# Patient Record
Sex: Female | Born: 1980 | Race: White | Hispanic: No | Marital: Single | Smoking: Never smoker
Health system: Southern US, Community
[De-identification: ages and names within clinical notes are randomized; demographics above are authoritative.]

## PROBLEM LIST (undated history)

## (undated) DIAGNOSIS — IMO0002 Reserved for concepts with insufficient information to code with codable children: Secondary | ICD-10-CM

## (undated) DIAGNOSIS — R87619 Unspecified abnormal cytological findings in specimens from cervix uteri: Secondary | ICD-10-CM

## (undated) HISTORY — PX: WISDOM TOOTH EXTRACTION: SHX21

---

## 2006-03-20 ENCOUNTER — Ambulatory Visit (HOSPITAL_COMMUNITY): Admission: RE | Admit: 2006-03-20 | Discharge: 2006-03-20 | Payer: Self-pay | Admitting: Obstetrics

## 2006-07-18 ENCOUNTER — Inpatient Hospital Stay (HOSPITAL_COMMUNITY): Admission: AD | Admit: 2006-07-18 | Discharge: 2006-07-20 | Payer: Self-pay | Admitting: Obstetrics & Gynecology

## 2007-02-05 ENCOUNTER — Emergency Department (HOSPITAL_COMMUNITY): Admission: EM | Admit: 2007-02-05 | Discharge: 2007-02-05 | Payer: Self-pay | Admitting: Emergency Medicine

## 2007-02-08 ENCOUNTER — Emergency Department (HOSPITAL_COMMUNITY): Admission: EM | Admit: 2007-02-08 | Discharge: 2007-02-08 | Payer: Self-pay | Admitting: Emergency Medicine

## 2009-01-10 ENCOUNTER — Ambulatory Visit (HOSPITAL_COMMUNITY): Admission: RE | Admit: 2009-01-10 | Discharge: 2009-01-10 | Payer: Self-pay | Admitting: Obstetrics & Gynecology

## 2009-06-01 ENCOUNTER — Inpatient Hospital Stay (HOSPITAL_COMMUNITY): Admission: AD | Admit: 2009-06-01 | Discharge: 2009-06-02 | Payer: Self-pay | Admitting: Obstetrics

## 2010-06-16 LAB — CBC
Hemoglobin: 9 g/dL — ABNORMAL LOW (ref 12.0–15.0)
Platelets: 226 10*3/uL (ref 150–400)
RBC: 3.68 MIL/uL — ABNORMAL LOW (ref 3.87–5.11)
RDW: 14.1 % (ref 11.5–15.5)
WBC: 11.2 10*3/uL — ABNORMAL HIGH (ref 4.0–10.5)
WBC: 12.6 10*3/uL — ABNORMAL HIGH (ref 4.0–10.5)

## 2010-06-16 LAB — RPR: RPR Ser Ql: NONREACTIVE

## 2010-12-31 LAB — URINE MICROSCOPIC-ADD ON

## 2010-12-31 LAB — URINALYSIS, ROUTINE W REFLEX MICROSCOPIC
Glucose, UA: NEGATIVE
Hgb urine dipstick: NEGATIVE
Ketones, ur: 15 — AB
Protein, ur: 30 — AB
Protein, ur: NEGATIVE
Specific Gravity, Urine: 1.024
Urobilinogen, UA: 0.2
Urobilinogen, UA: 0.2

## 2010-12-31 LAB — URINE CULTURE

## 2010-12-31 LAB — RAPID STREP SCREEN (MED CTR MEBANE ONLY): Streptococcus, Group A Screen (Direct): NEGATIVE

## 2010-12-31 LAB — POCT PREGNANCY, URINE
Operator id: 198171
Preg Test, Ur: NEGATIVE

## 2012-03-12 ENCOUNTER — Emergency Department (HOSPITAL_COMMUNITY)
Admission: EM | Admit: 2012-03-12 | Discharge: 2012-03-12 | Disposition: A | Discharge status: Home / Self Care | Payer: Medicaid Other | Attending: Emergency Medicine | Admitting: Emergency Medicine

## 2012-03-12 ENCOUNTER — Encounter (HOSPITAL_COMMUNITY): Payer: Self-pay | Admitting: *Deleted

## 2012-03-12 DIAGNOSIS — R110 Nausea: Secondary | ICD-10-CM | POA: Insufficient documentation

## 2012-03-12 DIAGNOSIS — Z3490 Encounter for supervision of normal pregnancy, unspecified, unspecified trimester: Secondary | ICD-10-CM

## 2012-03-12 DIAGNOSIS — R197 Diarrhea, unspecified: Secondary | ICD-10-CM | POA: Insufficient documentation

## 2012-03-12 DIAGNOSIS — R509 Fever, unspecified: Secondary | ICD-10-CM | POA: Insufficient documentation

## 2012-03-12 DIAGNOSIS — R1110 Vomiting, unspecified: Secondary | ICD-10-CM

## 2012-03-12 DIAGNOSIS — O212 Late vomiting of pregnancy: Secondary | ICD-10-CM | POA: Insufficient documentation

## 2012-03-12 LAB — URINALYSIS, ROUTINE W REFLEX MICROSCOPIC
Ketones, ur: 40 mg/dL — AB
Nitrite: NEGATIVE
Protein, ur: 30 mg/dL — AB
Urobilinogen, UA: 0.2 mg/dL (ref 0.0–1.0)

## 2012-03-12 LAB — URINE MICROSCOPIC-ADD ON

## 2012-03-12 MED ORDER — SODIUM CHLORIDE 0.9 % IV SOLN
1000.0000 mL | Freq: Once | INTRAVENOUS | Status: AC
  Administered 2012-03-12: 1000 mL via INTRAVENOUS

## 2012-03-12 MED ORDER — ONDANSETRON HCL 4 MG/2ML IJ SOLN
4.0000 mg | Freq: Once | INTRAMUSCULAR | Status: AC
  Administered 2012-03-12: 4 mg via INTRAVENOUS
  Filled 2012-03-12: qty 2

## 2012-03-12 MED ORDER — PROMETHAZINE HCL 25 MG/ML IJ SOLN
12.5000 mg | Freq: Once | INTRAMUSCULAR | Status: AC
  Administered 2012-03-12: 12.5 mg via INTRAVENOUS
  Filled 2012-03-12: qty 1

## 2012-03-12 MED ORDER — ONDANSETRON 4 MG PO TBDP: 4.0000 mg | ORAL_TABLET | Freq: Three times a day (TID) | ORAL | Status: DC | PRN

## 2012-03-12 MED ORDER — SODIUM CHLORIDE 0.9 % IV SOLN
1000.0000 mL | INTRAVENOUS | Status: DC
  Administered 2012-03-12: 1000 mL via INTRAVENOUS

## 2012-03-12 MED ORDER — PROMETHAZINE HCL 25 MG PO TABS: 25.0000 mg | ORAL_TABLET | Freq: Four times a day (QID) | ORAL | Status: DC | PRN

## 2012-03-12 MED ORDER — ONDANSETRON 4 MG PO TBDP
8.0000 mg | ORAL_TABLET | Freq: Once | ORAL | Status: AC
  Administered 2012-03-12: 8 mg via ORAL
  Filled 2012-03-12: qty 2

## 2012-03-12 NOTE — ED Provider Notes (Signed)
Medical screening examination/treatment/procedure(s) were performed by non-physician practitioner and as supervising physician I was immediately available for consultation/collaboration.    Nelia Shi, MD 03/12/12 [phone]

## 2012-03-12 NOTE — ED Notes (Signed)
Karen, PA at the bedside.  

## 2012-03-12 NOTE — ED Notes (Signed)
PT reports GI symptoms started approx. @AM .  Pt reports multiple episodes of N/V D. Pt also reports to be 25 wks gestion with no primary OB.

## 2012-03-12 NOTE — ED Notes (Signed)
Discharge instructions reviewed. Pt verbalized understanding.  

## 2012-03-12 NOTE — ED Provider Notes (Signed)
History     CSN: [id number]  Arrival date & time 03/12/12  0930   First MD Initiated Contact with Patient 03/12/12 1058      Chief Complaint  Patient presents with  . Nausea  . Emesis  . Diarrhea    (Consider location/radiation/quality/duration/timing/severity/associated sxs/prior treatment) Patient is a 31 y.o. female presenting with vomiting. The history is provided by the patient. No language interpreter was used.  Emesis  This is a new problem. The current episode started 12 to 24 hours ago. The problem occurs 5 to 10 times per day. The problem has not changed since onset.The emesis has an appearance of stomach contents. There has been no fever. Associated symptoms include chills and a fever. Pertinent negatives include no abdominal pain.  Pt has had vomiting and diarrhea.  Pt reports she is [redacted] weeks pregnant.  Pt has not had any prenatal care.   Pt is worried that she is getting dehydrated.    History reviewed. No pertinent past medical history.  History reviewed. No pertinent past surgical history.  No family history on file.  History  Substance Use Topics  . Smoking status: Never Smoker   . Smokeless tobacco: Not on file  . Alcohol Use: No    OB History    Grav Para Term Preterm Abortions TAB SAB Ect Mult Living   1               Review of Systems  Constitutional: Positive for fever and chills.  Gastrointestinal: Positive for vomiting. Negative for abdominal pain.  All other systems reviewed and are negative.    Allergies  Review of patient's allergies indicates no known allergies.  Home Medications  No current outpatient prescriptions on file.  BP 108/70  Pulse 116  Temp 98 F (36.7 C) (Oral)  Resp 18  SpO2 98%  LMP 08/01/2011  Physical Exam  Nursing note and vitals reviewed. Constitutional: She is oriented to person, place, and time. She appears well-developed and well-nourished.  HENT:  Head: Normocephalic and atraumatic.  Eyes:  Conjunctivae normal and EOM are normal. Pupils are equal, round, and reactive to light.  Neck: Normal range of motion. Neck supple.  Cardiovascular: Normal rate.   Pulmonary/Chest: Effort normal and breath sounds normal.  Abdominal: Soft. Bowel sounds are normal.  Musculoskeletal: Normal range of motion.  Neurological: She is alert and oriented to person, place, and time. She has normal reflexes.  Skin: Skin is warm.  Psychiatric: She has a normal mood and affect.    ED Course  Procedures (including critical care time)  Labs Reviewed  URINALYSIS, ROUTINE W REFLEX MICROSCOPIC - Abnormal; Notable for the following:    Color, Urine AMBER (*)  BIOCHEMICALS MAY BE AFFECTED BY COLOR   APPearance CLOUDY (*)     Ketones, ur 40 (*)     Protein, ur 30 (*)     Leukocytes, UA SMALL (*)     All other components within normal limits  URINE MICROSCOPIC-ADD ON - Abnormal; Notable for the following:    Squamous Epithelial / LPF MANY (*)     Bacteria, UA FEW (*)     Casts GRANULAR CAST (*)     All other components within normal limits  URINE CULTURE   No results found.   No diagnosis found.    MDM  Urine shows 40 ketones,   Pt given IV fluids x 2 liters,  zofran stopped vomiting.    Pt advised to go to [facility] hospital  if symptoms persist.   Pt given rx for zofran.     Lonia Skinner [place], [place] 03/12/12 1356

## 2012-03-13 LAB — URINE CULTURE: Colony Count: 6000

## 2012-03-24 NOTE — L&D Delivery Note (Signed)
Delivery Note At 10:04 PM a viable female was delivered via Vaginal, Spontaneous Delivery (Presentation: ;  ).  APGAR: , ; weight .   Placenta status: , .  Cord:  with the following complications: .  Cord pH: not done  Anesthesia: Epidural  Episiotomy:  Lacerations:  Suture Repair: 2.0 Est. Blood Loss (mL):   Mom to postpartum.  Baby to nursery-stable.  Kyliee Ortego A 06/10/2012, 10:09 PM

## 2012-04-20 LAB — OB RESULTS CONSOLE GC/CHLAMYDIA
Chlamydia: NEGATIVE
Chlamydia: NEGATIVE
Gonorrhea: NEGATIVE
Gonorrhea: NEGATIVE

## 2012-04-20 LAB — OB RESULTS CONSOLE HIV ANTIBODY (ROUTINE TESTING): HIV: NONREACTIVE

## 2012-04-20 LAB — OB RESULTS CONSOLE RPR
RPR: NONREACTIVE
RPR: NONREACTIVE

## 2012-04-20 LAB — OB RESULTS CONSOLE HEPATITIS B SURFACE ANTIGEN: Hepatitis B Surface Ag: NEGATIVE

## 2012-04-20 LAB — OB RESULTS CONSOLE ANTIBODY SCREEN: Antibody Screen: NEGATIVE

## 2012-06-06 ENCOUNTER — Encounter (HOSPITAL_COMMUNITY): Payer: Self-pay | Admitting: *Deleted

## 2012-06-06 ENCOUNTER — Inpatient Hospital Stay (HOSPITAL_COMMUNITY)
Admission: AD | Admit: 2012-06-06 | Discharge: 2012-06-06 | Disposition: A | Discharge status: Home / Self Care | Payer: Medicaid Other | Source: Ambulatory Visit | Attending: Obstetrics & Gynecology | Admitting: Obstetrics & Gynecology

## 2012-06-06 DIAGNOSIS — O479 False labor, unspecified: Secondary | ICD-10-CM | POA: Insufficient documentation

## 2012-06-06 HISTORY — DX: Reserved for concepts with insufficient information to code with codable children: IMO0002

## 2012-06-06 HISTORY — DX: Unspecified abnormal cytological findings in specimens from cervix uteri: R87.619

## 2012-06-06 NOTE — MAU Note (Signed)
Pt reports having dull contractions on and off all night. Reports some clear discharge no bleeding and good fetal movement.

## 2012-06-07 ENCOUNTER — Other Ambulatory Visit: Payer: Self-pay | Admitting: Obstetrics & Gynecology

## 2012-06-08 ENCOUNTER — Telehealth (HOSPITAL_COMMUNITY): Payer: Self-pay | Admitting: *Deleted

## 2012-06-08 ENCOUNTER — Encounter (HOSPITAL_COMMUNITY): Payer: Self-pay | Admitting: *Deleted

## 2012-06-08 NOTE — Telephone Encounter (Signed)
Preadmission screen  

## 2012-06-10 ENCOUNTER — Inpatient Hospital Stay (HOSPITAL_COMMUNITY)
Admission: AD | Admit: 2012-06-10 | Discharge: 2012-06-12 | DRG: 775 | Disposition: A | Discharge status: Home / Self Care | Payer: Medicaid Other | Source: Ambulatory Visit | Attending: Obstetrics | Admitting: Obstetrics

## 2012-06-10 ENCOUNTER — Encounter (HOSPITAL_COMMUNITY): Payer: Self-pay

## 2012-06-10 ENCOUNTER — Inpatient Hospital Stay (HOSPITAL_COMMUNITY): Payer: Medicaid Other | Admitting: Anesthesiology

## 2012-06-10 ENCOUNTER — Encounter (HOSPITAL_COMMUNITY): Payer: Self-pay | Admitting: Anesthesiology

## 2012-06-10 ENCOUNTER — Inpatient Hospital Stay (HOSPITAL_COMMUNITY): Payer: Medicaid Other

## 2012-06-10 DIAGNOSIS — O99892 Other specified diseases and conditions complicating childbirth: Principal | ICD-10-CM | POA: Diagnosis present

## 2012-06-10 DIAGNOSIS — Z22330 Carrier of Group B streptococcus: Secondary | ICD-10-CM

## 2012-06-10 LAB — CBC
HCT: 34.6 % — ABNORMAL LOW (ref 36.0–46.0)
MCH: 29.6 pg (ref 26.0–34.0)
MCV: 88.9 fL (ref 78.0–100.0)
RBC: 3.89 MIL/uL (ref 3.87–5.11)
WBC: 10.9 10*3/uL — ABNORMAL HIGH (ref 4.0–10.5)

## 2012-06-10 LAB — RPR: RPR Ser Ql: NONREACTIVE

## 2012-06-10 MED ORDER — LACTATED RINGERS IV SOLN: 500.0000 mL | INTRAVENOUS | Status: DC | PRN

## 2012-06-10 MED ORDER — ACETAMINOPHEN 325 MG PO TABS: 650.0000 mg | ORAL_TABLET | ORAL | Status: DC | PRN

## 2012-06-10 MED ORDER — PHENYLEPHRINE 40 MCG/ML (10ML) SYRINGE FOR IV PUSH (FOR BLOOD PRESSURE SUPPORT): 80.0000 ug | PREFILLED_SYRINGE | INTRAVENOUS | Status: DC | PRN

## 2012-06-10 MED ORDER — FENTANYL 2.5 MCG/ML BUPIVACAINE 1/10 % EPIDURAL INFUSION (WH - ANES)
INTRAMUSCULAR | Status: DC | PRN
  Administered 2012-06-10: 14 mL/h via EPIDURAL

## 2012-06-10 MED ORDER — DIPHENHYDRAMINE HCL 50 MG/ML IJ SOLN: 12.5000 mg | INTRAMUSCULAR | Status: DC | PRN

## 2012-06-10 MED ORDER — PHENYLEPHRINE 40 MCG/ML (10ML) SYRINGE FOR IV PUSH (FOR BLOOD PRESSURE SUPPORT)
80.0000 ug | PREFILLED_SYRINGE | INTRAVENOUS | Status: DC | PRN
  Filled 2012-06-10: qty 5

## 2012-06-10 MED ORDER — LACTATED RINGERS IV SOLN: 500.0000 mL | Freq: Once | INTRAVENOUS | Status: DC

## 2012-06-10 MED ORDER — PENICILLIN G POTASSIUM 5000000 UNITS IJ SOLR
2.5000 10*6.[IU] | INTRAVENOUS | Status: DC
  Filled 2012-06-10 (×2): qty 2.5

## 2012-06-10 MED ORDER — OXYTOCIN 40 UNITS IN LACTATED RINGERS INFUSION - SIMPLE MED
62.5000 mL/h | INTRAVENOUS | Status: DC
  Filled 2012-06-10: qty 1000

## 2012-06-10 MED ORDER — LACTATED RINGERS IV SOLN: INTRAVENOUS | Status: DC

## 2012-06-10 MED ORDER — EPHEDRINE 5 MG/ML INJ: 10.0000 mg | INTRAVENOUS | Status: DC | PRN

## 2012-06-10 MED ORDER — ONDANSETRON HCL 4 MG/2ML IJ SOLN: 4.0000 mg | Freq: Four times a day (QID) | INTRAMUSCULAR | Status: DC | PRN

## 2012-06-10 MED ORDER — CITRIC ACID-SODIUM CITRATE 334-500 MG/5ML PO SOLN: 30.0000 mL | ORAL | Status: DC | PRN

## 2012-06-10 MED ORDER — LIDOCAINE HCL (PF) 1 % IJ SOLN
30.0000 mL | INTRAMUSCULAR | Status: DC | PRN
  Filled 2012-06-10: qty 30

## 2012-06-10 MED ORDER — IBUPROFEN 600 MG PO TABS
600.0000 mg | ORAL_TABLET | Freq: Four times a day (QID) | ORAL | Status: DC | PRN
  Filled 2012-06-10 (×2): qty 1

## 2012-06-10 MED ORDER — LIDOCAINE HCL (PF) 1 % IJ SOLN
INTRAMUSCULAR | Status: DC | PRN
  Administered 2012-06-10 (×2): 8 mL

## 2012-06-10 MED ORDER — FENTANYL 2.5 MCG/ML BUPIVACAINE 1/10 % EPIDURAL INFUSION (WH - ANES)
14.0000 mL/h | INTRAMUSCULAR | Status: DC | PRN
  Filled 2012-06-10: qty 125

## 2012-06-10 MED ORDER — OXYCODONE-ACETAMINOPHEN 5-325 MG PO TABS: 1.0000 | ORAL_TABLET | ORAL | Status: DC | PRN

## 2012-06-10 MED ORDER — EPHEDRINE 5 MG/ML INJ
10.0000 mg | INTRAVENOUS | Status: DC | PRN
  Filled 2012-06-10: qty 4

## 2012-06-10 MED ORDER — BUTORPHANOL TARTRATE 1 MG/ML IJ SOLN: 1.0000 mg | INTRAMUSCULAR | Status: DC | PRN

## 2012-06-10 MED ORDER — OXYTOCIN BOLUS FROM INFUSION: 500.0000 mL | INTRAVENOUS | Status: DC

## 2012-06-10 MED ORDER — DEXTROSE 5 % IV SOLN
5.0000 10*6.[IU] | Freq: Once | INTRAVENOUS | Status: AC
  Administered 2012-06-10: 5 10*6.[IU] via INTRAVENOUS
  Filled 2012-06-10: qty 5

## 2012-06-10 NOTE — Anesthesia Procedure Notes (Signed)
Epidural Patient location during procedure: OB Start time: 06/10/2012 8:06 PM End time: 06/10/2012 8:10 PM  Staffing Anesthesiologist: Sandrea Hughs Performed by: anesthesiologist   Preanesthetic Checklist Completed: patient identified, site marked, surgical consent, pre-op evaluation, timeout performed, IV checked, risks and benefits discussed and monitors and equipment checked  Epidural Patient position: sitting Prep: site prepped and draped and DuraPrep Patient monitoring: continuous pulse ox and blood pressure Approach: midline Injection technique: LOR air  Needle:  Needle type: Tuohy  Needle gauge: 17 G Needle length: 9 cm and 9 Needle insertion depth: 5 cm cm Catheter type: closed end flexible Catheter size: 19 Gauge Catheter at skin depth: 10 cm Test dose: negative and Other  Assessment Sensory level: T9 Events: blood not aspirated, injection not painful, no injection resistance, negative IV test and no paresthesia  Additional Notes Reason for block:procedure for pain

## 2012-06-10 NOTE — MAU Note (Signed)
Dr. Gaynell Face returned call, notified of pt's cervical exam post u/s, cervix changed from 4.5-6, 70%, 0station. Orders to admit, pcn protocol

## 2012-06-10 NOTE — MAU Note (Signed)
Pt states bleeding noted today. Has never had bloody show with prior pregnancies when in labor. Concerned about amount of bleeding.

## 2012-06-10 NOTE — Anesthesia Preprocedure Evaluation (Signed)

## 2012-06-10 NOTE — H&P (Signed)
This is Dr. Francoise Ceo dictating the history and physical on  Charlotte Anderson she's a 32 year old gravida 5 para 3013 positive GBS 40 weeks and 3 days [facility] 06/07/2012 she was admitted in labor 6 cm she is now 7-8 cm vertex -1 station amniotomy performed the fluids clear she received penicillin for positive GBS Past medical history negative Past surgical history negative Social history negative System review noncontributory Physical exam well-developed female in labor HEENT negative Lungs clear to P&A Heart regular rhythm no murmurs no gallops Abdomen term Pelvic as described above Extremities negative

## 2012-06-11 ENCOUNTER — Ambulatory Visit (HOSPITAL_COMMUNITY): Payer: Medicaid Other

## 2012-06-11 LAB — CBC
HCT: 32.3 % — ABNORMAL LOW (ref 36.0–46.0)
MCV: 89.5 fL (ref 78.0–100.0)
RBC: 3.61 MIL/uL — ABNORMAL LOW (ref 3.87–5.11)
RDW: 14.7 % (ref 11.5–15.5)
WBC: 13.4 10*3/uL — ABNORMAL HIGH (ref 4.0–10.5)

## 2012-06-11 MED ORDER — PRENATAL MULTIVITAMIN CH
1.0000 | ORAL_TABLET | Freq: Every day | ORAL | Status: DC
  Administered 2012-06-11 – 2012-06-12 (×2): 1 via ORAL
  Filled 2012-06-11: qty 1

## 2012-06-11 MED ORDER — LANOLIN HYDROUS EX OINT: TOPICAL_OINTMENT | CUTANEOUS | Status: DC | PRN

## 2012-06-11 MED ORDER — DIPHENHYDRAMINE HCL 25 MG PO CAPS: 25.0000 mg | ORAL_CAPSULE | Freq: Four times a day (QID) | ORAL | Status: DC | PRN

## 2012-06-11 MED ORDER — TETANUS-DIPHTH-ACELL PERTUSSIS 5-2.5-18.5 LF-MCG/0.5 IM SUSP
0.5000 mL | Freq: Once | INTRAMUSCULAR | Status: AC
  Administered 2012-06-11: 0.5 mL via INTRAMUSCULAR
  Filled 2012-06-11: qty 0.5

## 2012-06-11 MED ORDER — ONDANSETRON HCL 4 MG/2ML IJ SOLN: 4.0000 mg | INTRAMUSCULAR | Status: DC | PRN

## 2012-06-11 MED ORDER — BENZOCAINE-MENTHOL 20-0.5 % EX AERO
1.0000 "application " | INHALATION_SPRAY | CUTANEOUS | Status: DC | PRN
  Administered 2012-06-11: 1 via TOPICAL
  Filled 2012-06-11: qty 56

## 2012-06-11 MED ORDER — SENNOSIDES-DOCUSATE SODIUM 8.6-50 MG PO TABS
2.0000 | ORAL_TABLET | Freq: Every day | ORAL | Status: DC
  Administered 2012-06-11 (×2): 2 via ORAL

## 2012-06-11 MED ORDER — ONDANSETRON HCL 4 MG PO TABS: 4.0000 mg | ORAL_TABLET | ORAL | Status: DC | PRN

## 2012-06-11 MED ORDER — IBUPROFEN 600 MG PO TABS
600.0000 mg | ORAL_TABLET | Freq: Four times a day (QID) | ORAL | Status: DC
  Administered 2012-06-11 – 2012-06-12 (×6): 600 mg via ORAL
  Filled 2012-06-11 (×5): qty 1

## 2012-06-11 MED ORDER — WITCH HAZEL-GLYCERIN EX PADS: 1.0000 "application " | MEDICATED_PAD | CUTANEOUS | Status: DC | PRN

## 2012-06-11 MED ORDER — ZOLPIDEM TARTRATE 5 MG PO TABS: 5.0000 mg | ORAL_TABLET | Freq: Every evening | ORAL | Status: DC | PRN

## 2012-06-11 MED ORDER — SIMETHICONE 80 MG PO CHEW: 80.0000 mg | CHEWABLE_TABLET | ORAL | Status: DC | PRN

## 2012-06-11 MED ORDER — OXYCODONE-ACETAMINOPHEN 5-325 MG PO TABS: 1.0000 | ORAL_TABLET | ORAL | Status: DC | PRN

## 2012-06-11 MED ORDER — DIBUCAINE 1 % RE OINT: 1.0000 "application " | TOPICAL_OINTMENT | RECTAL | Status: DC | PRN

## 2012-06-11 MED ORDER — FERROUS SULFATE 325 (65 FE) MG PO TABS
325.0000 mg | ORAL_TABLET | Freq: Two times a day (BID) | ORAL | Status: DC
  Administered 2012-06-11 – 2012-06-12 (×2): 325 mg via ORAL
  Filled 2012-06-11 (×2): qty 1

## 2012-06-11 NOTE — Progress Notes (Signed)
Post Partum Day 1 Subjective: no complaints  Objective: Blood pressure 114/87, pulse 84, temperature 97.6 F (36.4 C), temperature source Oral, resp. rate 20, height 5\' 5"  (1.651 m), weight 163 lb 8 oz (74.163 kg), last menstrual period 08/01/2011, SpO2 98.00%, unknown if currently breastfeeding.  Physical Exam:  General:  Alert.  No distress. Lochia: appropriate Uterine Fundus: firm Incision: healing well DVT Evaluation: No evidence of DVT seen on physical exam.   Recent Labs  06/10/12 1825 06/11/12 0605  HGB 11.5* 10.6*  HCT 34.6* 32.3*    Assessment/Plan: Plan for discharge tomorrow   LOS: 1 day   HARPER,CHARLES A 06/11/2012, 11:39 AM

## 2012-06-11 NOTE — Anesthesia Postprocedure Evaluation (Signed)
  Anesthesia Post-op Note  Patient: Charlotte Anderson  Procedure(s) Performed: * No procedures listed *  Patient Location: PACU and Mother/Baby  Anesthesia Type:Epidural  Level of Consciousness: awake, alert , oriented and patient cooperative  Airway and Oxygen Therapy: Patient Spontanous Breathing  Post-op Pain: none  Post-op Assessment: Post-op Vital signs reviewed and Patient's Cardiovascular Status Stable  Post-op Vital Signs: Reviewed and stable  Complications: No apparent anesthesia complications

## 2012-06-12 MED ORDER — IBUPROFEN 600 MG PO TABS: 600.0000 mg | ORAL_TABLET | Freq: Four times a day (QID) | ORAL | Status: DC | PRN

## 2012-06-12 MED ORDER — OXYCODONE-ACETAMINOPHEN 5-325 MG PO TABS: 1.0000 | ORAL_TABLET | ORAL | Status: DC | PRN

## 2012-06-12 NOTE — Progress Notes (Signed)
Post Partum Day 2 Subjective: no complaints  Objective: Blood pressure 105/70, pulse 77, temperature 97.9 F (36.6 C), temperature source Oral, resp. rate 18, height 5\' 5"  (1.651 m), weight 163 lb 8 oz (74.163 kg), last menstrual period 08/01/2011, SpO2 99.00%, unknown if currently breastfeeding.  Physical Exam:  General: alert and no distress Lochia: appropriate Uterine Fundus: firm Incision: none DVT Evaluation: No evidence of DVT seen on physical exam.   Recent Labs  06/10/12 1825 06/11/12 0605  HGB 11.5* 10.6*  HCT 34.6* 32.3*    Assessment/Plan: Discharge home   LOS: 2 days   HARPER,CHARLES A 06/12/2012, 8:47 AM

## 2012-06-12 NOTE — Discharge Summary (Signed)
Obstetric Discharge Summary Reason for Admission: onset of labor Prenatal Procedures: ultrasound Intrapartum Procedures: spontaneous vaginal delivery Postpartum Procedures: none Complications-Operative and Postpartum: none Hemoglobin  Date Value Range Status  06/11/2012 10.6* 12.0 - 15.0 g/dL Final     HCT  Date Value Range Status  06/11/2012 32.3* 36.0 - 46.0 % Final    Physical Exam:  General: alert and no distress Lochia: appropriate Uterine Fundus: firm Incision: None DVT Evaluation: No evidence of DVT seen on physical exam.  Discharge Diagnoses: Term Pregnancy-delivered  Discharge Information: Date: 06/12/2012 Activity: pelvic rest Diet: routine Medications: PNV, Ibuprofen, Colace and Percocet Condition: stable Instructions: refer to practice specific booklet Discharge to: home Follow-up Information   Follow up with Barron Vanloan A, MD. Schedule an appointment as soon as possible for a visit in 6 weeks.   Contact information:   [address] [place] [address] [phone]       Newborn Data: Live born female  Birth Weight: 7 lb 14.6 oz (3589 g) APGAR: 10, 10  Home with mother.  Charlotte Anderson A 06/12/2012, 8:56 AM

## 2012-06-14 ENCOUNTER — Encounter: Payer: Self-pay | Admitting: Obstetrics

## 2012-06-15 ENCOUNTER — Inpatient Hospital Stay (HOSPITAL_COMMUNITY)
Admission: RE | Admit: 2012-06-15 | Discharge: 2012-06-15 | Disposition: A | Discharge status: Home / Self Care | Payer: Medicaid Other | Source: Ambulatory Visit | Attending: Obstetrics | Admitting: Obstetrics

## 2012-07-15 ENCOUNTER — Encounter: Payer: Self-pay | Admitting: Obstetrics

## 2012-07-15 ENCOUNTER — Ambulatory Visit (INDEPENDENT_AMBULATORY_CARE_PROVIDER_SITE_OTHER): Payer: Medicaid Other | Admitting: Obstetrics

## 2012-07-15 NOTE — Progress Notes (Signed)
Subjective:     Charlotte Anderson is a 32 y.o. female who presents for a postpartum visit. She is 5 weeks postpartum following a spontaneous vaginal delivery. I have fully reviewed the prenatal and intrapartum course. The delivery was at 40.3 gestational weeks. Outcome: spontaneous vaginal delivery. Anesthesia: epidural. Postpartum course has been normal. Baby's course has been normal. Baby is feeding by breast. Bleeding thin lochia. Bowel function is normal. Bladder function is normal. Patient is not sexually active. Contraception method is abstinence. Postpartum depression screening: negative.   The following portions of the patient's history were reviewed and updated as appropriate: allergies, current medications, past family history, past medical history, past social history, past surgical history and problem list.  Review of Systems Pertinent items are noted in HPI.   Objective:    BP 104/74  Pulse 96  Temp(Src) 98.3 F (36.8 C)  Ht 5\' 5"  (1.651 m)  Wt 144 lb (65.318 kg)  BMI 23.96 kg/m2  Breastfeeding? Yes  General:  alert and no distress   Breasts:  inspection negative, no nipple discharge or bleeding, no masses or nodularity palpable  Lungs: not examined  Heart:  not examined  Abdomen: normal findings: soft, non-tender   Vulva:  normal  Vagina: normal vagina  Cervix:  no lesions  Corpus: normal size, contour, position, consistency, mobility, non-tender  Adnexa:  normal adnexa  Rectal Exam: Not performed.        Assessment:     Normal postpartum exam. Pap smear not done at today's visit.   Plan:    1. Contraception: none 2. Wants tubal sterilization 3. Follow up in: 2 weeks or as needed.

## 2012-07-15 NOTE — Patient Instructions (Signed)
Tubal sterilization methods.

## 2012-08-05 ENCOUNTER — Ambulatory Visit (INDEPENDENT_AMBULATORY_CARE_PROVIDER_SITE_OTHER): Payer: Medicaid Other | Admitting: Obstetrics & Gynecology

## 2012-08-05 ENCOUNTER — Encounter: Payer: Self-pay | Admitting: Obstetrics & Gynecology

## 2012-08-05 VITALS — BP 118/84 | HR 83 | Temp 97.7°F | Ht 65.0 in | Wt 144.4 lb

## 2012-08-05 DIAGNOSIS — Z3202 Encounter for pregnancy test, result negative: Secondary | ICD-10-CM

## 2012-08-05 DIAGNOSIS — Z3009 Encounter for other general counseling and advice on contraception: Secondary | ICD-10-CM

## 2012-08-05 NOTE — Patient Instructions (Signed)
Laparoscopic Tubal Ligation Laparoscopic tubal ligation is a procedure that closes the fallopian tubes at a time other than right after childbirth. By closing the fallopian tubes, the eggs that are released from the ovaries cannot enter the uterus and sperm cannot reach the egg. Tubal ligation is also known as getting your "tubes tied." Tubal ligation is done so you will not be able to get pregnant or have a baby.  Although this procedure may be reversed, it should be considered permanent and irreversible. If you want to have future pregnancies, you should not have this procedure.  LET YOUR CAREGIVER KNOW ABOUT:  Allergies to food or medicine.  Medicines taken, including vitamins, herbs, eyedrops, over-the-counter medicines, and creams.  Use of steroids (by mouth or creams).  Previous problems with numbing medicines.  History of bleeding problems or blood clots.  Any recent colds or infections.  Previous surgery.  Other health problems, including diabetes and kidney problems.  Possibility of pregnancy, if this applies.  Any past pregnancies. RISKS AND COMPLICATIONS   Infection.  Bleeding.  Injury to surrounding organs.  Anesthetic side effects.  Failure of the procedure.  Ectopic pregnancy.  Future regret about having the procedure done. BEFORE THE PROCEDURE  Do not take aspirin or blood thinners a week before the procedure or as directed. This can cause bleeding.  Do not eat or drink anything 6 to 8 hours before the procedure. PROCEDURE   You may be given a medicine to help you relax (sedative) before the procedure. You will be given a medicine to make you sleep (general anesthetic) during the procedure.  A tube will be put down your throat to help your breath while under general anesthesia.  Two small cuts (incisions) are made in the lower abdominal area and near the belly button.  Your abdominal area will be inflated with a safe gas (carbon dioxide). This helps  give the surgeon room to operate, visualize, and helps the surgeon avoid other organs.  A thin, lighted tube (laparoscope) with a camera attached is inserted into your abdomen through one of the incisions near the belly button. Other small instruments are also inserted through the other abdominal incision.  The fallopian tubes are located and are either blocked with a ring, clip, or are burned (cauterized).  After the fallopian tubes are blocked, the gas is released from the abdomen.  The incisions will be closed with stitches (sutures), and a bandage may be placed over the incisions. AFTER THE PROCEDURE   You will rest in a recovery room for 1 4 hours until you are stable and doing well.  You will also have some mild abdominal discomfort for 3 7 days. You will be given pain medicine to ease any discomfort.  As long as there are no problems, you may be allowed to go home. Someone will need to drive you home and be with you for at least 24 hours once home.  You may have some mild discomfort in the throat. This is from the tube placed in your throat while you were sleeping.  You may experience discomfort in the shoulder area from some trapped air between the liver and diaphragm. This sensation is normal and will slowly go away on its own. Document Released: 06/16/2000 Document Revised: 09/09/2011 Document Reviewed: 06/21/2011 Exit[employer] Patient Information 2013 [employer].  

## 2012-08-05 NOTE — Progress Notes (Signed)
   Subjective:    Charlotte Anderson is a 32 y.o. female who presents for contraception counseling.  She is interested in getting a tubal ligationThe patient has no complaints today. The patient is sexually active. Pertinent past medical history: none.  Menstrual History: OB History   Grav Para Term Preterm Abortions TAB SAB Ect Mult Living   5 4 4  1 1    4       Menarche age: 18 No LMP recorded. Patient is not currently having periods (Reason: Lactating).    The following portions of the patient's history were reviewed and updated as appropriate: allergies, current medications, past family history, past medical history, past social history, past surgical history and problem list.  Review of Systems Pertinent items are noted in HPI.   Objective:     No exam     Assessment:    32 y.o., desires sterilization  Plan:   A laparoscopic BTL is planned

## 2012-08-24 ENCOUNTER — Inpatient Hospital Stay (HOSPITAL_COMMUNITY): Admission: RE | Admit: 2012-08-24 | Payer: Medicaid Other | Source: Ambulatory Visit

## 2012-08-26 ENCOUNTER — Encounter (HOSPITAL_COMMUNITY): Payer: Self-pay

## 2012-08-26 ENCOUNTER — Encounter (HOSPITAL_COMMUNITY)
Admission: RE | Admit: 2012-08-26 | Discharge: 2012-08-26 | Disposition: A | Discharge status: Home / Self Care | Payer: Medicaid Other | Source: Ambulatory Visit | Attending: Obstetrics & Gynecology | Admitting: Obstetrics & Gynecology

## 2012-08-26 ENCOUNTER — Encounter (HOSPITAL_COMMUNITY): Payer: Self-pay | Admitting: Pharmacy Technician

## 2012-08-26 LAB — CBC
HCT: 38.1 % (ref 36.0–46.0)
MCV: 87.6 fL (ref 78.0–100.0)
RBC: 4.35 MIL/uL (ref 3.87–5.11)
WBC: 8.1 10*3/uL (ref 4.0–10.5)

## 2012-08-26 NOTE — Patient Instructions (Addendum)
   Your procedure is scheduled on: Friday, June 13   Enter through the [employer] of [facility] at: 1030 am Pick up the phone at the desk and dial [phone] and inform us of your arrival.  Please call this number if you have any problems the morning of surgery: [phone]  Remember: Do not eat food after midnight: Thursday Do not drink clear liquids after: 8 am Friday, June 13 Take these medicines the morning of surgery with a SIP OF WATER: None  Do not wear jewelry, make-up, or FINGER nail polish No metal in your hair or on your body. Do not wear lotions, powders, perfumes. You may wear deodorant.  Please use your CHG wash as directed prior to surgery.  Do not shave anywhere for at least 12 hours prior to first CHG shower.  Do not bring valuables to the hospital. Contacts, dentures or bridgework may not be worn into surgery.  Patients discharged on the day of surgery will not be allowed to drive home.  Home with friend Merian Capron cell [phone].

## 2012-08-31 ENCOUNTER — Encounter: Payer: Self-pay | Admitting: Obstetrics

## 2012-08-31 ENCOUNTER — Other Ambulatory Visit: Payer: Self-pay | Admitting: *Deleted

## 2012-09-02 NOTE — H&P (Signed)
  Chief Complaint: 32 y.o. who presents with for a laparoscopic BTL  Details of Present Illness: See above.  There were no vitals taken for this visit.  Past Medical History  Diagnosis Date  . Abnormal Pap smear   . SVD (spontaneous vaginal delivery)     x 4   History   Social History  . Marital Status: Single    Spouse Name: N/A    Number of Children: N/A  . Years of Education: N/A   Occupational History  . Not on file.   Social History Main Topics  . Smoking status: Never Smoker   . Smokeless tobacco: Never Used  . Alcohol Use: No  . Drug Use: No  . Sexually Active: Yes    Birth Control/ Protection: Condom   Other Topics Concern  . Not on file   Social History Narrative  . No narrative on file   Family History  Problem Relation Age of Onset  . Cancer Mother     ovarian  . Hypertension Mother   . Vision loss Maternal Grandmother     cataracts  . Hypertension Maternal Grandmother   . Heart disease Maternal Grandfather   . Hypertension Maternal Grandfather   . COPD Paternal Grandmother     Pertinent items are noted in HPI.  Pre-Op Diagnosis: desires sterilization   Planned Procedure: Procedure(s): LAPAROSCOPIC TUBAL LIGATION  I have reviewed the patient's history and have completed the physical exam and Charlotte Anderson is acceptable for surgery.  Roseanna Rainbow, MD 09/02/2012 7:26 PM

## 2012-09-03 ENCOUNTER — Ambulatory Visit (HOSPITAL_COMMUNITY): Payer: Medicaid Other

## 2012-09-03 ENCOUNTER — Encounter (HOSPITAL_COMMUNITY): Payer: Self-pay | Admitting: Anesthesiology

## 2012-09-03 ENCOUNTER — Encounter (HOSPITAL_COMMUNITY): Payer: Self-pay

## 2012-09-03 ENCOUNTER — Ambulatory Visit (HOSPITAL_COMMUNITY)
Admission: RE | Admit: 2012-09-03 | Discharge: 2012-09-03 | Disposition: A | Discharge status: Home / Self Care | Payer: Medicaid Other | Source: Ambulatory Visit | Attending: Obstetrics & Gynecology | Admitting: Obstetrics & Gynecology

## 2012-09-03 ENCOUNTER — Encounter (HOSPITAL_COMMUNITY)
Admission: RE | Disposition: A | Discharge status: Home / Self Care | Payer: Self-pay | Source: Ambulatory Visit | Attending: Obstetrics & Gynecology

## 2012-09-03 DIAGNOSIS — Z641 Problems related to multiparity: Secondary | ICD-10-CM | POA: Diagnosis not present

## 2012-09-03 DIAGNOSIS — Z302 Encounter for sterilization: Secondary | ICD-10-CM | POA: Insufficient documentation

## 2012-09-03 DIAGNOSIS — Z3009 Encounter for other general counseling and advice on contraception: Secondary | ICD-10-CM

## 2012-09-03 HISTORY — PX: LAPAROSCOPIC TUBAL LIGATION: SHX1937

## 2012-09-03 LAB — PREGNANCY, URINE: Preg Test, Ur: NEGATIVE

## 2012-09-03 SURGERY — LIGATION, FALLOPIAN TUBE, LAPAROSCOPIC
Anesthesia: General | Site: Abdomen | Laterality: Bilateral | Wound class: Clean Contaminated

## 2012-09-03 MED ORDER — PROPOFOL 10 MG/ML IV EMUL
INTRAVENOUS | Status: AC
  Filled 2012-09-03: qty 20

## 2012-09-03 MED ORDER — FENTANYL CITRATE 0.05 MG/ML IJ SOLN
INTRAMUSCULAR | Status: DC | PRN
  Administered 2012-09-03 (×5): 50 ug via INTRAVENOUS

## 2012-09-03 MED ORDER — LACTATED RINGERS IV SOLN: INTRAVENOUS | Status: DC

## 2012-09-03 MED ORDER — GLYCOPYRROLATE 0.2 MG/ML IJ SOLN
INTRAMUSCULAR | Status: AC
  Filled 2012-09-03: qty 2

## 2012-09-03 MED ORDER — LIDOCAINE HCL (CARDIAC) 20 MG/ML IV SOLN
INTRAVENOUS | Status: AC
  Filled 2012-09-03: qty 5

## 2012-09-03 MED ORDER — KETOROLAC TROMETHAMINE 30 MG/ML IJ SOLN
INTRAMUSCULAR | Status: AC
  Filled 2012-09-03: qty 1

## 2012-09-03 MED ORDER — MIDAZOLAM HCL 2 MG/2ML IJ SOLN
INTRAMUSCULAR | Status: AC
  Filled 2012-09-03: qty 2

## 2012-09-03 MED ORDER — LIDOCAINE HCL (CARDIAC) 20 MG/ML IV SOLN
INTRAVENOUS | Status: DC | PRN
  Administered 2012-09-03: 50 mg via INTRAVENOUS

## 2012-09-03 MED ORDER — KETOROLAC TROMETHAMINE 30 MG/ML IJ SOLN
INTRAMUSCULAR | Status: DC | PRN
  Administered 2012-09-03: 30 mg via INTRAVENOUS

## 2012-09-03 MED ORDER — LACTATED RINGERS IV SOLN
INTRAVENOUS | Status: DC
  Administered 2012-09-03 (×2): via INTRAVENOUS

## 2012-09-03 MED ORDER — PROPOFOL 10 MG/ML IV BOLUS
INTRAVENOUS | Status: DC | PRN
  Administered 2012-09-03: 150 mg via INTRAVENOUS
  Administered 2012-09-03: 50 mg via INTRAVENOUS

## 2012-09-03 MED ORDER — NEOSTIGMINE METHYLSULFATE 1 MG/ML IJ SOLN
INTRAMUSCULAR | Status: DC | PRN
  Administered 2012-09-03: 4 mg via INTRAVENOUS

## 2012-09-03 MED ORDER — DEXAMETHASONE SODIUM PHOSPHATE 10 MG/ML IJ SOLN
INTRAMUSCULAR | Status: AC
  Filled 2012-09-03: qty 1

## 2012-09-03 MED ORDER — BUPIVACAINE HCL (PF) 0.25 % IJ SOLN
INTRAMUSCULAR | Status: DC | PRN
  Administered 2012-09-03: 2 mL

## 2012-09-03 MED ORDER — BUPIVACAINE HCL (PF) 0.25 % IJ SOLN
INTRAMUSCULAR | Status: AC
  Filled 2012-09-03: qty 30

## 2012-09-03 MED ORDER — 0.9 % SODIUM CHLORIDE (POUR BTL) OPTIME
TOPICAL | Status: DC | PRN
  Administered 2012-09-03: 60 mL

## 2012-09-03 MED ORDER — MIDAZOLAM HCL 5 MG/5ML IJ SOLN
INTRAMUSCULAR | Status: DC | PRN
  Administered 2012-09-03: 2 mg via INTRAVENOUS

## 2012-09-03 MED ORDER — ROCURONIUM BROMIDE 50 MG/5ML IV SOLN
INTRAVENOUS | Status: AC
  Filled 2012-09-03: qty 1

## 2012-09-03 MED ORDER — FENTANYL CITRATE 0.05 MG/ML IJ SOLN
INTRAMUSCULAR | Status: AC
  Filled 2012-09-03: qty 5

## 2012-09-03 MED ORDER — ONDANSETRON HCL 4 MG/2ML IJ SOLN
INTRAMUSCULAR | Status: DC | PRN
  Administered 2012-09-03: 4 mg via INTRAVENOUS

## 2012-09-03 MED ORDER — OXYCODONE-ACETAMINOPHEN 5-325 MG PO TABS: 2.0000 | ORAL_TABLET | Freq: Four times a day (QID) | ORAL | Status: DC | PRN

## 2012-09-03 MED ORDER — GLYCOPYRROLATE 0.2 MG/ML IJ SOLN
INTRAMUSCULAR | Status: DC | PRN
  Administered 2012-09-03: .8 mg via INTRAVENOUS

## 2012-09-03 MED ORDER — ROCURONIUM BROMIDE 100 MG/10ML IV SOLN
INTRAVENOUS | Status: DC | PRN
  Administered 2012-09-03: 30 mg via INTRAVENOUS

## 2012-09-03 MED ORDER — ONDANSETRON HCL 4 MG/2ML IJ SOLN
INTRAMUSCULAR | Status: AC
  Filled 2012-09-03: qty 2

## 2012-09-03 MED ORDER — DEXAMETHASONE SODIUM PHOSPHATE 4 MG/ML IJ SOLN
INTRAMUSCULAR | Status: DC | PRN
  Administered 2012-09-03: 10 mg via INTRAVENOUS

## 2012-09-03 SURGICAL SUPPLY — 20 items
ADH SKN CLS APL DERMABOND .7 (GAUZE/BANDAGES/DRESSINGS) ×1
CATH ROBINSON RED A/P 16FR (CATHETERS) ×2 IMPLANT
CHLORAPREP W/TINT 26ML (MISCELLANEOUS) ×2 IMPLANT
CLOTH BEACON ORANGE TIMEOUT ST (SAFETY) ×2 IMPLANT
DECANTER SPIKE VIAL GLASS SM (MISCELLANEOUS) ×1 IMPLANT
DERMABOND ADVANCED (GAUZE/BANDAGES/DRESSINGS) ×1
DERMABOND ADVANCED .7 DNX12 (GAUZE/BANDAGES/DRESSINGS) ×1 IMPLANT
GLOVE BIO SURGEON STRL SZ 6.5 (GLOVE) ×2 IMPLANT
GOWN PREVENTION PLUS LG XLONG (DISPOSABLE) ×4 IMPLANT
NEEDLE INSUFFLATION 150MM (ENDOMECHANICALS) ×1 IMPLANT
NS IRRIG 1000ML POUR BTL (IV SOLUTION) ×1 IMPLANT
PACK LAPAROSCOPY BASIN (CUSTOM PROCEDURE TRAY) ×2 IMPLANT
SCRUB PCMX 4 OZ (MISCELLANEOUS) ×2 IMPLANT
SUT VIC AB 3-0 PS2 18 (SUTURE) ×2
SUT VIC AB 3-0 PS2 18XBRD (SUTURE) IMPLANT
SUT VICRYL 0 UR6 27IN ABS (SUTURE) ×1 IMPLANT
SYR 50ML LL SCALE MARK (SYRINGE) ×1 IMPLANT
TOWEL OR 17X24 6PK STRL BLUE (TOWEL DISPOSABLE) ×4 IMPLANT
TROCAR XCEL NON-BLD 11X100MML (ENDOMECHANICALS) ×2 IMPLANT
WATER STERILE IRR 1000ML POUR (IV SOLUTION) ×1 IMPLANT

## 2012-09-03 NOTE — Op Note (Signed)
Procedure Note  Charlotte Anderson 31 y.o. 09/03/2012  Preoperative Diagnosis:  Multiparity, desires a sterilization procedure  Postoperative Diagnosis: Same  Procedure: Laparoscopic bilateral tubal ligation with fulguration  Surgeon: Antionette Char A  Indications:  The patient now presents for a sterilization procedure after discussing therapeutic alternatives.        Procedure Detail:  The patient was taken to the operating room and was placed on the operating table in the dorsal supine position.  After his satisfactory general anesthesia was achieved, the patient was placed in the semi-lithotomy position using Allen stirrups. The patient was prepped and draped in the usual sterile manner for vaginal laparoscopic procedure. A speculum was placed in the vagina. The anterior lip of the cervix was grasped with a single-tooth tenaculum. A Hulka manipulator was then advanced into the uterus and secured  to the anterior lip of the cervix as a means to manipulate the uterus. The single-tooth tenaculum and Hulka manipulator were then removed. The infraumbilical region was then anesthetized with local anesthesia, 0.25%  Marcaine. A small incision was made to the skin and subcutaneous tissue. A 10 mm Optiview trocar was placed through the incision into the abdominal cavity diagnostic laparoscope with video camera attached was placed through the trocar sleeve and carbon dioxide was used to insufflate the abdominal and pelvic cavity. The pelvic contents were examined and the findings were described below. Kleppinger bipolar forceps were inserted through the operative port of the laparoscope. The left fallopian tube was identified and traced out to its fimbriated end. Then starting at the distal isthmus the proximal ampullary portion of the tube was coagulated in 4 contiguous areas. Each time the resistance meter went to 0 and the tube had been retracted away from an adjacent viscera. The right fallopian tube  was then manipulated in a similar fashion. The uterine manipulator had perforated the uterine fundus. There was a small amount of bleeding noted from this area. The perforated area was then coagulated with the Kleppinger bipolar forceps. Adequate hemostasis was noted. The scope was removed and the excess carbon dioxide was remove through the port, before it was removed.  The subcutaneous layer of the incision was reapproximated with an interrupted figure-of eight 0-Vicryl suture on a UR 6 needle. The skin was reapproximated with running subcuticluar stitches of 4-0 Vicryl suture. Dermabond was then applied. The instruments were removed from the vagina and there was minimal bleeding from the cervix. Final sponge, instrument and needle counts were correct. The patient was awakened on the operating table and taken to the PACU in satisfactory condition.    Findings: Normal uterus tubes and ovaries   Estimated Blood Loss:  Minimal              Total IV Fluids: Per anesthesiology        Condition: stable

## 2012-09-03 NOTE — Anesthesia Postprocedure Evaluation (Signed)
Anesthesia Post Note  Patient: Charlotte Anderson  Procedure(s) Performed: Procedure(s) (LRB): LAPAROSCOPIC TUBAL LIGATION (Bilateral)  Anesthesia type: GA  Patient location: PACU  Post pain: Pain level controlled  Post assessment: Post-op Vital signs reviewed  Last Vitals:  Filed Vitals:   09/03/12 1349  BP: 134/89  Pulse: 89  Temp: 37 C  Resp: 18    Post vital signs: Reviewed  Level of consciousness: sedated  Complications: No apparent anesthesia complications

## 2012-09-03 NOTE — Anesthesia Preprocedure Evaluation (Addendum)
Anesthesia Evaluation  Patient identified by MRN, date of birth, ID band Patient awake    Reviewed: Allergy & Precautions, H&P , NPO status , Patient's Chart, lab work & pertinent test results  Airway Mallampati: I TM Distance: >3 FB Neck ROM: full    Dental no notable dental hx. (+) Teeth Intact   Pulmonary neg pulmonary ROS,    Pulmonary exam normal       Cardiovascular negative cardio ROS      Neuro/Psych negative neurological ROS  negative psych ROS   GI/Hepatic negative GI ROS, Neg liver ROS,   Endo/Other  negative endocrine ROS  Renal/GU negative Renal ROS  negative genitourinary   Musculoskeletal negative musculoskeletal ROS (+)   Abdominal Normal abdominal exam  (+)   Peds negative pediatric ROS (+)  Hematology negative hematology ROS (+)   Anesthesia Other Findings   Reproductive/Obstetrics negative OB ROS                           Anesthesia Physical Anesthesia Plan  ASA: I  Anesthesia Plan: General   Post-op Pain Management:    Induction: Intravenous  Airway Management Planned: Oral ETT  Additional Equipment:   Intra-op Plan:   Post-operative Plan: Extubation in OR  Informed Consent: I have reviewed the patients History and Physical, chart, labs and discussed the procedure including the risks, benefits and alternatives for the proposed anesthesia with the patient or authorized representative who has indicated his/her understanding and acceptance.   Dental Advisory Given  Plan Discussed with: CRNA and Surgeon  Anesthesia Plan Comments:        Anesthesia Quick Evaluation

## 2012-09-03 NOTE — Transfer of Care (Signed)
Immediate Anesthesia Transfer of Care Note  Patient: Charlotte Anderson  Procedure(s) Performed: Procedure(s): LAPAROSCOPIC TUBAL LIGATION (Bilateral)  Patient Location: PACU  Anesthesia Type:General  Level of Consciousness: awake, alert  and oriented  Airway & Oxygen Therapy: Patient Spontanous Breathing and Patient connected to nasal cannula oxygen  Post-op Assessment: Report given to PACU RN and Post -op Vital signs reviewed and stable  Post vital signs: Reviewed and stable  Complications: No apparent anesthesia complications

## 2012-09-03 NOTE — Op Note (Deleted)
Pre-operative Diagnosis: abnormal uterine bleeding and fibroids  Post-operative Diagnosis: same  Operation: Robotic-assisted myomectomies  Surgeon: JACKSON-MOORE,Cherysh Epperly A  Assistant: Charles Harper, MD  Anesthesia: GET  Urine Output: Per anesthesiology  Findings: The uterus was diffusely enlarged irregular in contour. There were 2 intramural myomas. There was an anterior myoma that was approximately 6 cm in diameter. There is a posterior myoma that was approximately 8 cm in diameter. Deep to the posterior myoma there was a 4 cm intramural myoma. The tubes and ovaries were normal in appearance.  Estimated Blood Loss: less than 100 mL  Total IV Fluids: Per anesthesiology  Specimens: PATHOLOGY  Complications: None; patient tolerated the procedure well.  Disposition: PACU - hemodynamically stable.  Condition: Stable  Procedure Details  The patient was seen in the Holding Room. The risks, benefits, complications, treatment options, and expected outcomes were discussed with the patient. The patient concurred with the proposed plan, giving informed consent. The site of surgery properly noted/marked. The patient was identified as Charlotte Anderson and the procedure verified as a Robotic-assisted myomectomies. A Time Out was held and the above information confirmed.  After induction of anesthesia, the patient was draped and prepped in the usual sterile manner. Pt was placed in supine position after anesthesia and draped and prepped in the usual sterile manner. The abdominal drape was placed after the CholoraPrep had been allowed to dry for 3 minutes. Her arms were tucked to her side with all appropriate precautions. The patient was placed in the semi-lithotomy position in Allen stirrups. The perineum was prepped with Betadine. Foley catheter was placed. A sterile speculum was placed in the vagina. The cervix was grasped with a single-tooth tenaculum and dilated with Pratt dilators. The ZUMI uterine manipulator  was placed without difficulty. OG tube placement was confirmed and to suction. Approximately 2 cm below the costal margin, in the midclavicular line the skin was anesthestized with 0.25% Marcaine. A 5 mm incision was made and using a 5 mm Optiview, a 5 mm trocar was placed under direct vision. The patient's abdomen was insufflated with CO2 gas. At this point and all points during the procedure, the patient's intra-abdominal pressure did not exceed 15 mmHg. A 10-12 camera port was place 24 cm above the pubic symphysis. Bilateral 8 mm ports were place 10 cm and 15 degrees inferior. An 8 mm port was placed in the right upper quadrant. All ports were placed under direct visualization. The 5 mm port was removed. The incision was extended to accommodate a 10 mm trocar. A 10 mm trocar and sleeve were advanced under direct visualization. The robot was docked in the usual fashion. The myometrium overlying the anterior myoma was then infiltrated with a dilute Pitressin solution. Using monopolar cutting current the serosa and myometrium were incised in a transverse fashion. The incision was extended down to the pseudocapsule of the myoma. The myoma was then grasped with a tenaculum. Using blunt dissection and countertraction the myoma was extracted. The posterior and deep myomas were manipulated in a similar fashion. The myoma bed for the anterior myoma was closed in a running fashion using a 2-0 V lock suture. The myoma bed of the posterior myoma was closed in 2 layers using a running suture of 2-0 V lock. Adequate hemostasis was noted. The pelvis was then irrigated. A separate film slurry was then drizzled over the incisions.The needles were removed without difficulty. The robot was undocked. The trocar was removed from the assistant port site and replaced with   a morcellator. The myomas were then morcellated. Care was taken to remove any fibroid fragments from the abdomen and pelvis. The instruments were then removed under  direct visualization and the robotic ports removed. Deep, subcutaneous, figure-of-eight 0-Vicryl sutures on a UR-6 needle were placed in the 10-12 mm supraumbilical and infracostal incisions. All skin incisions were closed in a subcuticular fashion using 4-0 Vicryl. Dermabond was applied. All instrument and needle counts were correct x 2.   

## 2012-09-03 NOTE — Interval H&P Note (Signed)
History and Physical Interval Note:  09/03/2012 12:48 PM  Charlotte Anderson  has presented today for surgery, with the diagnosis of desires sterilization  The various methods of treatment have been discussed with the patient and family. After consideration of risks, benefits and other options for treatment, the patient has consented to  Procedure(s): LAPAROSCOPIC TUBAL LIGATION (Bilateral) as a surgical intervention .  The patient's history has been reviewed, patient examined, no change in status, stable for surgery.  I have reviewed the patient's chart and labs.  Questions were answered to the patient's satisfaction.     JACKSON-MOORE,Latria Mccarron A

## 2012-09-03 NOTE — Anesthesia Procedure Notes (Signed)
Procedure Name: Intubation Date/Time: 09/03/2012 1:03 PM Performed by: Earmon Phoenix Pre-anesthesia Checklist: Patient identified, Patient being monitored, Emergency Drugs available, Suction available and Timeout performed Patient Re-evaluated:Patient Re-evaluated prior to inductionOxygen Delivery Method: Circle system utilized Preoxygenation: Pre-oxygenation with 100% oxygen Intubation Type: IV induction Ventilation: Mask ventilation without difficulty Laryngoscope Size: Mac and 3 Grade View: Grade I Tube type: Oral Tube size: 7.0 mm Number of attempts: 1 Airway Equipment and Method: Stylet Placement Confirmation: ETT inserted through vocal cords under direct vision,  positive ETCO2,  CO2 detector and breath sounds checked- equal and bilateral Secured at: 21 cm Dental Injury: Teeth and Oropharynx as per pre-operative assessment

## 2012-09-06 ENCOUNTER — Encounter (HOSPITAL_COMMUNITY): Payer: Self-pay | Admitting: Obstetrics & Gynecology

## 2012-09-20 ENCOUNTER — Encounter: Payer: Self-pay | Admitting: Obstetrics & Gynecology

## 2012-09-20 ENCOUNTER — Ambulatory Visit (INDEPENDENT_AMBULATORY_CARE_PROVIDER_SITE_OTHER): Payer: Medicaid Other | Admitting: Obstetrics & Gynecology

## 2012-09-20 VITALS — BP 123/76 | HR 60 | Temp 98.2°F | Ht 65.0 in | Wt 144.0 lb

## 2012-09-20 DIAGNOSIS — Z09 Encounter for follow-up examination after completed treatment for conditions other than malignant neoplasm: Secondary | ICD-10-CM

## 2012-09-20 NOTE — Progress Notes (Signed)
Subjective:     Charlotte Anderson is a 32 y.o. female who presents to the clinic 2 weeks status post bilateral tubal ligation for requested sterilization. Eating a regular diet with difficulty. Bowel movements are normal. The patient is not having any pain.  The following portions of the patient's history were reviewed and updated as appropriate: allergies, current medications, past family history, past medical history, past social history, past surgical history and problem list.  Review of Systems Pertinent items are noted in HPI.    Objective:    BP 123/76  Pulse 60  Temp(Src) 98.2 F (36.8 C)  Ht 5\' 5"  (1.651 m)  Wt 144 lb (65.318 kg)  BMI 23.96 kg/m2  Breastfeeding? Yes General:  alert  Abdomen: soft, bowel sounds active, non-tender  Incision:   healing well, no drainage, no erythema, no hernia, no seroma, no swelling, resolving ecchymosis, no dehiscence, incision well approximated     Assessment:    Doing well postoperatively. Operative findings again reviewed. Pathology report discussed.    Plan:    F/U prn

## 2012-10-14 ENCOUNTER — Ambulatory Visit: Payer: Medicaid Other | Admitting: Obstetrics & Gynecology

## 2012-10-14 ENCOUNTER — Encounter: Payer: Self-pay | Admitting: Obstetrics & Gynecology

## 2012-10-14 ENCOUNTER — Ambulatory Visit (INDEPENDENT_AMBULATORY_CARE_PROVIDER_SITE_OTHER): Payer: Medicaid Other | Admitting: Obstetrics & Gynecology

## 2012-10-14 VITALS — BP 122/86 | HR 78 | Temp 98.0°F | Ht 65.0 in | Wt 146.8 lb

## 2012-10-14 DIAGNOSIS — Z09 Encounter for follow-up examination after completed treatment for conditions other than malignant neoplasm: Secondary | ICD-10-CM

## 2012-10-14 NOTE — Progress Notes (Signed)
.   Subjective:     Charlotte Anderson is a 32 y.o. female who presents to the clinic 6 weeks status post Tubal Ligation for requested sterilization. Eating a regular diet with difficulty. Bowel movements are normal. The patient is not having any pain.  The following portions of the patient's history were reviewed and updated as appropriate: allergies, current medications, past family history, past medical history, past social history, past surgical history and problem list.  Review of Systems Pertinent items are noted in HPI.    Objective:    BP 122/86  Pulse 78  Temp(Src) 98 F (36.7 C) (Oral)  Ht 5\' 5"  (1.651 m)  Wt 146 lb 12.8 oz (66.588 kg)  BMI 24.43 kg/m2  Breastfeeding? Yes General:  alert  Abdomen: soft, bowel sounds active, non-tender  Incision:   healing well, no drainage, no erythema, no hernia, no seroma, no swelling, no dehiscence, incision well approximated      SPEC: Vagina normal in appearance, cervix without lesions; uterus and adnexa nontender on bimanual exam Assessment:    Doing well postoperatively. Operative findings again reviewed. Pathology report discussed.    Plan:    1. Continue any current medications. 2. Wound care discussed. 3. Activity restrictions: none 4. Follow up: prn

## 2012-10-20 ENCOUNTER — Ambulatory Visit: Payer: Medicaid Other | Admitting: Obstetrics & Gynecology

## 2012-11-05 ENCOUNTER — Encounter: Payer: Medicaid Other | Admitting: Obstetrics

## 2012-11-05 ENCOUNTER — Ambulatory Visit: Payer: Medicaid Other | Admitting: Obstetrics

## 2012-11-11 ENCOUNTER — Encounter: Payer: Self-pay | Admitting: Obstetrics

## 2012-11-11 ENCOUNTER — Ambulatory Visit (INDEPENDENT_AMBULATORY_CARE_PROVIDER_SITE_OTHER): Payer: Medicaid Other | Admitting: Obstetrics

## 2012-11-11 VITALS — BP 110/81 | HR 106 | Temp 98.1°F | Wt 150.0 lb

## 2012-11-11 DIAGNOSIS — N61 Mastitis without abscess: Secondary | ICD-10-CM

## 2012-11-11 MED ORDER — AMOXICILLIN-POT CLAVULANATE 875-125 MG PO TABS: 1.0000 | ORAL_TABLET | Freq: Two times a day (BID) | ORAL | Status: DC

## 2012-11-11 NOTE — Progress Notes (Signed)
Subjective:     Charlotte Anderson is a 32 y.o. female here for a routine exam.  Current complaints: possible mastitis. Pt states she is breast feeding and has been experiecning pain for about one week. Pt states she is putting baby to breast often and is pumping. Pt states she has tried Ibuprofen and warm compresses and it helps to relieve the pain some. Pt states she has redness on her breast and under her arm. Pt feels like she has blisters on her nipple.   Personal health questionnaire reviewed: yes.   Gynecologic History No LMP recorded. Patient is not currently having periods (Reason: Lactating). Contraception: tubal ligation   Obstetric History OB History  Gravida Para Term Preterm AB SAB TAB Ectopic Multiple Living  5 4 4  1  1   4     # Outcome Date GA Lbr Len/2nd Weight Sex Delivery Anes PTL Lv  5 TRM 06/10/12 [redacted]w[redacted]d 05:22 / 00:12 7 lb 14.6 oz (3.589 kg) F SVD EPI  Y     Comments: Normal newborn  4 TRM 2011 [redacted]w[redacted]d 12:00 8 lb 6 oz (3.799 kg) F SVD EPI  Y  3 TRM 2008 [redacted]w[redacted]d 06:00 8 lb 13 oz (3.997 kg) F SVD EPI  Y  2 TAB 2005          1 TRM 2000 [redacted]w[redacted]d 07:00 7 lb 12 oz (3.515 kg) F SVD EPI  Y       The following portions of the patient's history were reviewed and updated as appropriate: allergies, current medications, past family history, past medical history, past social history, past surgical history and problem list.  Review of Systems Pertinent items are noted in HPI.    Objective:    General appearance: alert and no distress Breasts: Right breast tender and indurated axillary tail.    Assessment:    Acute mastitis, right breast.   Plan:    Education reviewed: Management of acute mastitis. Augmentin Rx.  \

## 2012-11-12 ENCOUNTER — Encounter: Payer: Self-pay | Admitting: Obstetrics

## 2013-01-27 ENCOUNTER — Other Ambulatory Visit: Payer: Self-pay

## 2013-07-13 ENCOUNTER — Ambulatory Visit: Payer: Medicaid Other | Admitting: Obstetrics

## 2013-07-26 ENCOUNTER — Ambulatory Visit (INDEPENDENT_AMBULATORY_CARE_PROVIDER_SITE_OTHER): Payer: Medicaid Other | Admitting: Obstetrics

## 2013-07-26 ENCOUNTER — Encounter: Payer: Self-pay | Admitting: Obstetrics

## 2013-07-26 VITALS — BP 115/79 | HR 105 | Temp 98.6°F | Ht 65.0 in | Wt 145.0 lb

## 2013-07-26 DIAGNOSIS — F3289 Other specified depressive episodes: Secondary | ICD-10-CM

## 2013-07-26 DIAGNOSIS — Z0000 Encounter for general adult medical examination without abnormal findings: Secondary | ICD-10-CM

## 2013-07-26 DIAGNOSIS — Z01419 Encounter for gynecological examination (general) (routine) without abnormal findings: Secondary | ICD-10-CM

## 2013-07-26 DIAGNOSIS — F329 Major depressive disorder, single episode, unspecified: Secondary | ICD-10-CM

## 2013-07-26 DIAGNOSIS — Z113 Encounter for screening for infections with a predominantly sexual mode of transmission: Secondary | ICD-10-CM

## 2013-07-26 MED ORDER — CITALOPRAM HYDROBROMIDE 20 MG PO TABS: 20.0000 mg | ORAL_TABLET | Freq: Every day | ORAL | Status: DC

## 2013-07-26 NOTE — Progress Notes (Signed)
Subjective:     Charlotte Anderson is a 33 y.o. female here for a routine exam.  Current complaints: emotionally unstable.    Personal health questionnaire:  Is patient Charlotte Anderson, have a family history of breast and/or ovarian cancer: no Is there a family history of uterine cancer diagnosed at age < 7950, gastrointestinal cancer, urinary tract cancer, family member who is a Personnel officerLynch syndrome-associated carrier: no Is the patient overweight and hypertensive, family history of diabetes, personal history of gestational diabetes or PCOS: no Is patient over 3855, have PCOS,  family history of premature CHD under age 33, diabetes, smoke, have hypertension or peripheral artery disease:  no  The HPI was reviewed and explored in further detail by the provider. Gynecologic History Patient's last menstrual period was 07/06/2013. Contraception: tubal ligation Last Pap: 2014. Results were: normal Last mammogram: n/a. Results were: n/a  Obstetric History OB History  Gravida Para Term Preterm AB SAB TAB Ectopic Multiple Living  5 4 4  1  1   4     # Outcome Date GA Lbr Len/2nd Weight Sex Delivery Anes PTL Lv  5 TRM 06/10/12 1363w3d 05:22 / 00:12 7 lb 14.6 oz (3.589 kg) F SVD EPI  Y     Comments: Normal newborn  4 TRM 2011 5839w0d 12:00 8 lb 6 oz (3.799 kg) F SVD EPI  Y  3 TRM 2008 3560w0d 06:00 8 lb 13 oz (3.997 kg) F SVD EPI  Y  2 TAB 2005          1 TRM 2000 4860w0d 07:00 7 lb 12 oz (3.515 kg) F SVD EPI  Y      Past Medical History  Diagnosis Date  . Abnormal Pap smear   . SVD (spontaneous vaginal delivery)     x 4    Past Surgical History  Procedure Laterality Date  . Wisdom tooth extraction    . Laparoscopic tubal ligation Bilateral 09/03/2012    Procedure: LAPAROSCOPIC TUBAL LIGATION;  Surgeon: [name], MD;  Location: [facility];  Service: Gynecology;  Laterality: Bilateral;    Current outpatient prescriptions:citalopram (CELEXA) 20 MG tablet, Take 1 tablet (20 mg total) by mouth daily.,  Disp: 30 tablet, Rfl: 11 No Known Allergies  History  Substance Use Topics  . Smoking status: Never Smoker   . Smokeless tobacco: Never Used  . Alcohol Use: No    Family History  Problem Relation Age of Onset  . Cancer Mother     ovarian  . Hypertension Mother   . Vision loss Maternal Grandmother     cataracts  . Hypertension Maternal Grandmother   . Heart disease Maternal Grandfather   . Hypertension Maternal Grandfather   . COPD Paternal Grandmother       Review of Systems  Constitutional: negative for fatigue and weight loss Respiratory: negative for cough and wheezing Cardiovascular: negative for chest pain, fatigue and palpitations Gastrointestinal: negative for abdominal pain and change in bowel habits Musculoskeletal:negative for myalgias Neurological: negative for gait problems and tremors Behavioral/Psych: negative for abusive relationship, depression Endocrine: negative for temperature intolerance   Genitourinary:negative for abnormal menstrual periods, genital lesions, hot flashes, sexual problems and vaginal discharge Integument/breast: negative for breast lump, breast tenderness, nipple discharge and skin lesion(s)    Objective:       General:   alert  Skin:   no rash or abnormalities  Lungs:   clear to auscultation bilaterally  Heart:   regular rate and rhythm, S1, S2 normal, no  murmur, click, rub or gallop  Breasts:   normal without suspicious masses, skin or nipple changes or axillary nodes  Abdomen:  normal findings: no organomegaly, soft, non-tender and no hernia  Pelvis:  External genitalia: normal general appearance Urinary system: urethral meatus normal and bladder without fullness, nontender Vaginal: normal without tenderness, induration or masses Cervix: normal appearance Adnexa: normal bimanual exam Uterus: anteverted and non-tender, normal size   Lab Review Urine pregnancy test Labs reviewed yes Radiologic studies reviewed  no   Assessment:    Healthy female exam.   Depression / Anxiety   Plan:    Education reviewed: depression evaluation, safe sex/STD prevention, self breast exams and management of Depression / Anxiety, medically.. Follow up in: 3 months. Celexa Rx   Meds ordered this encounter  Medications  . citalopram (CELEXA) 20 MG tablet    Sig: Take 1 tablet (20 mg total) by mouth daily.    Dispense:  30 tablet    Refill:  11   Orders Placed This Encounter  Procedures  . WET PREP BY MOLECULAR PROBE  . GC/Chlamydia Probe Amp  . Hepatitis B surface antigen  . RPR  . Hepatitis C antibody  . HIV antibody    Possible management options include:  Medication and behavioral modification for Depression.

## 2013-07-27 LAB — HEPATITIS C ANTIBODY: HCV AB: NEGATIVE

## 2013-07-27 LAB — WET PREP BY MOLECULAR PROBE
CANDIDA SPECIES: POSITIVE — AB
GARDNERELLA VAGINALIS: POSITIVE — AB
TRICHOMONAS VAG: NEGATIVE

## 2013-07-27 LAB — HEPATITIS B SURFACE ANTIGEN: HEP B S AG: NEGATIVE

## 2013-07-27 LAB — HIV ANTIBODY (ROUTINE TESTING W REFLEX): HIV 1&2 Ab, 4th Generation: NONREACTIVE

## 2013-07-27 LAB — GC/CHLAMYDIA PROBE AMP
CT PROBE, AMP APTIMA: NEGATIVE
GC Probe RNA: NEGATIVE

## 2013-07-27 LAB — RPR

## 2013-07-28 LAB — PAP IG W/ RFLX HPV ASCU

## 2013-08-01 LAB — HUMAN PAPILLOMAVIRUS, HIGH RISK: HPV DNA High Risk: NOT DETECTED

## 2013-08-09 ENCOUNTER — Other Ambulatory Visit: Payer: Self-pay | Admitting: *Deleted

## 2013-08-09 DIAGNOSIS — N760 Acute vaginitis: Principal | ICD-10-CM

## 2013-08-09 DIAGNOSIS — B379 Candidiasis, unspecified: Secondary | ICD-10-CM

## 2013-08-09 DIAGNOSIS — B9689 Other specified bacterial agents as the cause of diseases classified elsewhere: Secondary | ICD-10-CM

## 2013-08-09 MED ORDER — METRONIDAZOLE 500 MG PO TABS: 500.0000 mg | ORAL_TABLET | Freq: Two times a day (BID) | ORAL | Status: DC

## 2013-08-09 MED ORDER — FLUCONAZOLE 150 MG PO TABS: 150.0000 mg | ORAL_TABLET | Freq: Once | ORAL | Status: DC

## 2013-10-24 ENCOUNTER — Encounter: Payer: Self-pay | Admitting: Obstetrics

## 2013-10-24 ENCOUNTER — Ambulatory Visit (INDEPENDENT_AMBULATORY_CARE_PROVIDER_SITE_OTHER): Payer: Medicaid Other | Admitting: Obstetrics

## 2013-10-24 VITALS — BP 111/73 | HR 73 | Temp 98.1°F | Ht 65.0 in | Wt 140.0 lb

## 2013-10-24 DIAGNOSIS — F329 Major depressive disorder, single episode, unspecified: Secondary | ICD-10-CM

## 2013-10-24 DIAGNOSIS — F3289 Other specified depressive episodes: Secondary | ICD-10-CM

## 2013-10-24 NOTE — Progress Notes (Signed)
Patient ID: Charlotte Anderson, female   DOB: [birth year], 33 y.o.   MRN: [id number]  Chief Complaint  Patient presents with  . Follow-up    Medication Management     HPI Charlotte SowKristen E Matsumura is a 33 y.o. female.  Presents for F/U after starting antidepressive medication.  Feeling much better.  Feels that medication is working well and wants to continue. HPI  Past Medical History  Diagnosis Date  . Abnormal Pap smear   . SVD (spontaneous vaginal delivery)     x 4    Past Surgical History  Procedure Laterality Date  . Wisdom tooth extraction    . Laparoscopic tubal ligation Bilateral 09/03/2012    Procedure: LAPAROSCOPIC TUBAL LIGATION;  Surgeon: [name], MD;  Location: [facility];  Service: Gynecology;  Laterality: Bilateral;    Family History  Problem Relation Age of Onset  . Cancer Mother     ovarian  . Hypertension Mother   . Vision loss Maternal Grandmother     cataracts  . Hypertension Maternal Grandmother   . Heart disease Maternal Grandfather   . Hypertension Maternal Grandfather   . COPD Paternal Grandmother     Social History History  Substance Use Topics  . Smoking status: Never Smoker   . Smokeless tobacco: Never Used  . Alcohol Use: Yes     Comment: occasionally     No Known Allergies  Current Outpatient Prescriptions  Medication Sig Dispense Refill  . citalopram (CELEXA) 20 MG tablet Take 1 tablet (20 mg total) by mouth daily.  30 tablet  11   No current facility-administered medications for this visit.    Review of Systems Review of Systems Constitutional: negative for fatigue and weight loss Respiratory: negative for cough and wheezing Cardiovascular: negative for chest pain, fatigue and palpitations Gastrointestinal: negative for abdominal pain and change in bowel habits Genitourinary:negative Integument/breast: negative for nipple discharge Musculoskeletal:negative for myalgias Neurological: negative for gait problems and  tremors Behavioral/Psych: negative for abusive relationship, depression Endocrine: negative for temperature intolerance     Blood pressure 111/73, pulse 73, temperature 98.1 F (36.7 C), height 5\' 5"  (1.651 m), weight 140 lb (63.504 kg), last menstrual period 09/25/2013, currently breastfeeding.  Physical Exam Physical Exam:  Deferred    Data Reviewed Medication  Assessment    Depression.  Improved with medication.       Plan   Continue Celexa.  No orders of the defined types were placed in this encounter.   No orders of the defined types were placed in this encounter.       Alise Calais A 10/24/2013, 2:08 PM

## 2013-10-25 ENCOUNTER — Encounter: Payer: Self-pay | Admitting: Obstetrics

## 2014-01-06 ENCOUNTER — Other Ambulatory Visit: Payer: Self-pay

## 2014-01-23 ENCOUNTER — Encounter: Payer: Self-pay | Admitting: Obstetrics

## 2014-10-18 ENCOUNTER — Telehealth: Payer: Self-pay | Admitting: *Deleted

## 2014-10-18 DIAGNOSIS — F32A Depression, unspecified: Secondary | ICD-10-CM

## 2014-10-18 DIAGNOSIS — F329 Major depressive disorder, single episode, unspecified: Secondary | ICD-10-CM

## 2014-10-18 MED ORDER — CITALOPRAM HYDROBROMIDE 20 MG PO TABS: 20.0000 mg | ORAL_TABLET | Freq: Every day | ORAL | Status: DC

## 2014-10-18 NOTE — Telephone Encounter (Signed)
Patient is requesting a refill of her antidepressant- she has an appointment in September. Rx refilled per Dr Clearance Coots permission.

## 2014-10-19 ENCOUNTER — Ambulatory Visit: Payer: Medicaid Other | Admitting: Obstetrics

## 2016-01-16 ENCOUNTER — Ambulatory Visit: Payer: Medicaid Other | Admitting: Obstetrics

## 2016-02-22 ENCOUNTER — Ambulatory Visit (INDEPENDENT_AMBULATORY_CARE_PROVIDER_SITE_OTHER): Payer: PRIVATE HEALTH INSURANCE | Admitting: Obstetrics

## 2016-02-22 ENCOUNTER — Encounter: Payer: Self-pay | Admitting: Obstetrics

## 2016-02-22 VITALS — BP 108/76 | HR 91 | Temp 100.6°F | Wt 137.4 lb

## 2016-02-22 DIAGNOSIS — N939 Abnormal uterine and vaginal bleeding, unspecified: Secondary | ICD-10-CM

## 2016-02-22 DIAGNOSIS — Z01411 Encounter for gynecological examination (general) (routine) with abnormal findings: Secondary | ICD-10-CM | POA: Diagnosis not present

## 2016-02-22 DIAGNOSIS — Z124 Encounter for screening for malignant neoplasm of cervix: Secondary | ICD-10-CM

## 2016-02-22 DIAGNOSIS — Z01419 Encounter for gynecological examination (general) (routine) without abnormal findings: Secondary | ICD-10-CM | POA: Diagnosis not present

## 2016-02-22 DIAGNOSIS — R102 Pelvic and perineal pain: Secondary | ICD-10-CM

## 2016-02-22 NOTE — Progress Notes (Signed)
Subjective:        Charlotte Anderson is a 35 y.o. female here for a routine exam.  Current complaints: Pain with intercourse.  Vaginal bleeding after intercourse.  Personal health questionnaire:  Is patient [name], have a family history of breast and/or ovarian cancer: no Is there a family history of uterine cancer diagnosed at age < 2650, gastrointestinal cancer, urinary tract cancer, family member who is a Personnel officerLynch syndrome-associated carrier: no Is the patient overweight and hypertensive, family history of diabetes, personal history of gestational diabetes, preeclampsia or PCOS: no Is patient over 4955, have PCOS,  family history of premature CHD under age 35, diabetes, smoke, have hypertension or peripheral artery disease:  no At any time, has a partner hit, kicked or otherwise hurt or frightened you?: no Over the past 2 weeks, have you felt down, depressed or hopeless?: no Over the past 2 weeks, have you felt little interest or pleasure in doing things?:no   Gynecologic History Patient's last menstrual period was 02/01/2016 (approximate). Contraception: tubal ligation Last Pap:.2015     Results were:   ASCUS with negative HPV Last mammogram: n/a. Results were: n/a  Obstetric History OB History  Gravida Para Term Preterm AB Living  5 4 4   1 4   SAB TAB Ectopic Multiple Live Births    1     4    # Outcome Date GA Lbr Len/2nd Weight Sex Delivery Anes PTL Lv  5 Term 06/10/12 5053w3d 05:22 / 00:12 7 lb 14.6 oz (3.589 kg) F Vag-Spont EPI  LIV     Birth Comments: Normal newborn  4 Term 2011 3464w0d 12:00 8 lb 6 oz (3.799 kg) F Vag-Spont EPI  LIV  3 Term 2008 1334w0d 06:00 8 lb 13 oz (3.997 kg) F Vag-Spont EPI  LIV  2 TAB 2005          1 Term 2000 4034w0d 07:00 7 lb 12 oz (3.515 kg) F Vag-Spont EPI  LIV      Past Medical History:  Diagnosis Date  . Abnormal Pap smear   . SVD (spontaneous vaginal delivery)    x 4    Past Surgical History:  Procedure Laterality Date  .  LAPAROSCOPIC TUBAL LIGATION Bilateral 09/03/2012   Procedure: LAPAROSCOPIC TUBAL LIGATION;  Surgeon: [name], MD;  Location: [facility];  Service: Gynecology;  Laterality: Bilateral;  . WISDOM TOOTH EXTRACTION       Current Outpatient Prescriptions:  .  citalopram (CELEXA) 20 MG tablet, Take 1 tablet (20 mg total) by mouth daily. (Patient not taking: Reported on 02/22/2016), Disp: 30 tablet, Rfl: 5 No Known Allergies  Social History  Substance Use Topics  . Smoking status: Never Smoker  . Smokeless tobacco: Never Used  . Alcohol use Yes     Comment: occasionally     Family History  Problem Relation Age of Onset  . Cancer Mother     ovarian  . Hypertension Mother   . Vision loss Maternal Grandmother     cataracts  . Hypertension Maternal Grandmother   . Heart disease Maternal Grandfather   . Hypertension Maternal Grandfather   . COPD Paternal Grandmother       Review of Systems  Constitutional: negative for fatigue and weight loss Respiratory: negative for cough and wheezing Cardiovascular: negative for chest pain, fatigue and palpitations Gastrointestinal: negative for abdominal pain and change in bowel habits Musculoskeletal:negative for myalgias Neurological: negative for gait problems and tremors Behavioral/Psych: negative for abusive relationship,  depression Endocrine: negative for temperature intolerance    Genitourinary:positive for abnormal vaginal bleeding and pain with intercourse Integument/breast: negative for breast lump, breast tenderness, nipple discharge and skin lesion(s)    Objective:       BP 108/76   Pulse 91   Temp (!) 100.6 F (38.1 C) (Oral)   Wt 137 lb 6.4 oz (62.3 kg)   LMP 02/01/2016 (Approximate)   BMI 22.86 kg/m  General:   alert  Skin:   no rash or abnormalities  Lungs:   clear to auscultation bilaterally  Heart:   regular rate and rhythm, S1, S2 normal, no murmur, click, rub or gallop  Breasts:   normal without suspicious  masses, skin or nipple changes or axillary nodes  Abdomen:  normal findings: no organomegaly, soft, non-tender and no hernia  Pelvis:  External genitalia: normal general appearance Urinary system: urethral meatus normal and bladder without fullness, nontender Vaginal: normal without tenderness, induration or masses Cervix: normal appearance Adnexa: tender to palpation left adnexa.  No masses. Uterus: anteverted and non-tender, normal size   Lab Review Urine pregnancy test Labs reviewed yes Radiologic studies reviewed no  50% of 20 min visit spent on counseling and coordination of care.    Assessment:    Healthy female exam.    AUB  Pelvic pain   Plan:    Ultrasound ordered  Education reviewed: low fat, low cholesterol diet, self breast exams and weight bearing exercise. Contraception: tubal ligation. Follow up in: 2 weeks.   No orders of the defined types were placed in this encounter.  No orders of the defined types were placed in this encounter.    Patient ID: Charlotte Anderson, female   DOB: Jul 21, 1980, 35 y.o.   MRN: [id number]

## 2016-02-22 NOTE — Addendum Note (Signed)
Addended by: Francene FindersJAMES, Shaira Sova C on: 02/22/2016 11:37 AM   Modules accepted: Orders

## 2016-02-27 ENCOUNTER — Telehealth: Payer: Self-pay

## 2016-02-27 ENCOUNTER — Other Ambulatory Visit: Payer: Self-pay | Admitting: Obstetrics

## 2016-02-27 DIAGNOSIS — N760 Acute vaginitis: Principal | ICD-10-CM

## 2016-02-27 DIAGNOSIS — B9689 Other specified bacterial agents as the cause of diseases classified elsewhere: Secondary | ICD-10-CM

## 2016-02-27 LAB — NUSWAB VG+, CANDIDA 6SP
ATOPOBIUM VAGINAE: HIGH {score} — AB
BVAB 2: HIGH {score} — AB
CANDIDA GLABRATA, NAA: NEGATIVE
CANDIDA KRUSEI, NAA: NEGATIVE
CANDIDA PARAPSILOSIS, NAA: NEGATIVE
CANDIDA TROPICALIS, NAA: NEGATIVE
Candida albicans, NAA: NEGATIVE
Candida lusitaniae, NAA: NEGATIVE
Chlamydia trachomatis, NAA: NEGATIVE
Megasphaera 1: HIGH Score — AB
Neisseria gonorrhoeae, NAA: NEGATIVE
TRICH VAG BY NAA: NEGATIVE

## 2016-02-27 LAB — CYTOLOGY - PAP: HPV (WINDOPATH): DETECTED — AB

## 2016-02-27 MED ORDER — METRONIDAZOLE 500 MG PO TABS: 500.0000 mg | ORAL_TABLET | Freq: Two times a day (BID) | ORAL | 2 refills | Status: DC

## 2016-02-27 NOTE — Telephone Encounter (Signed)
Attempted to contact pt about rx, number on file does not accept incoming calls.

## 2016-03-11 ENCOUNTER — Other Ambulatory Visit: Payer: PRIVATE HEALTH INSURANCE

## 2016-03-11 ENCOUNTER — Ambulatory Visit: Payer: PRIVATE HEALTH INSURANCE | Admitting: Obstetrics

## 2016-04-01 ENCOUNTER — Ambulatory Visit (INDEPENDENT_AMBULATORY_CARE_PROVIDER_SITE_OTHER): Payer: PRIVATE HEALTH INSURANCE | Admitting: Obstetrics

## 2016-04-01 ENCOUNTER — Encounter: Payer: Self-pay | Admitting: Obstetrics

## 2016-04-01 ENCOUNTER — Ambulatory Visit (INDEPENDENT_AMBULATORY_CARE_PROVIDER_SITE_OTHER): Payer: PRIVATE HEALTH INSURANCE

## 2016-04-01 DIAGNOSIS — N939 Abnormal uterine and vaginal bleeding, unspecified: Secondary | ICD-10-CM | POA: Diagnosis not present

## 2016-04-01 DIAGNOSIS — R87612 Low grade squamous intraepithelial lesion on cytologic smear of cervix (LGSIL): Secondary | ICD-10-CM

## 2016-04-01 DIAGNOSIS — R102 Pelvic and perineal pain: Secondary | ICD-10-CM | POA: Diagnosis not present

## 2016-04-01 NOTE — Progress Notes (Signed)
Patient ID: Charlotte Anderson, female   DOB: 1981/01/31, 36 y.o.   MRN: [id number]  No chief complaint on file.   HPI Charlotte Anderson is a 36 y.o. female.  H/O pain with intercourse.  Also has abnormal pap smear - LGSIL.  Ultrasound done today.  Patient presents for ultrasound results and consult for colposcopy. HPI  Past Medical History:  Diagnosis Date  . Abnormal Pap smear   . SVD (spontaneous vaginal delivery)    x 4    Past Surgical History:  Procedure Laterality Date  . LAPAROSCOPIC TUBAL LIGATION Bilateral 09/03/2012   Procedure: LAPAROSCOPIC TUBAL LIGATION;  Surgeon: [name], MD;  Location: [facility];  Service: Gynecology;  Laterality: Bilateral;  . WISDOM TOOTH EXTRACTION      Family History  Problem Relation Age of Onset  . Cancer Mother     ovarian  . Hypertension Mother   . Vision loss Maternal Grandmother     cataracts  . Hypertension Maternal Grandmother   . Heart disease Maternal Grandfather   . Hypertension Maternal Grandfather   . COPD Paternal Grandmother     Social History Social History  Substance Use Topics  . Smoking status: Never Smoker  . Smokeless tobacco: Never Used  . Alcohol use Yes     Comment: occasionally     No Known Allergies  Current Outpatient Prescriptions  Medication Sig Dispense Refill  . citalopram (CELEXA) 20 MG tablet Take 1 tablet (20 mg total) by mouth daily. (Patient not taking: Reported on 02/22/2016) 30 tablet 5  . metroNIDAZOLE (FLAGYL) 500 MG tablet Take 1 tablet (500 mg total) by mouth 2 (two) times daily. 14 tablet 2   No current facility-administered medications for this visit.     Review of Systems Review of Systems Constitutional: negative for fatigue and weight loss Respiratory: negative for cough and wheezing Cardiovascular: negative for chest pain, fatigue and palpitations Gastrointestinal: negative for abdominal pain and change in bowel habits Genitourinary:positive for painful  intercourse Integument/breast: negative for nipple discharge Musculoskeletal:negative for myalgias Neurological: negative for gait problems and tremors Behavioral/Psych: negative for abusive relationship, depression Endocrine: negative for temperature intolerance      currently breastfeeding.  Physical Exam Physical Exam:  Deferred                                                                                                                                                          >50% of 10 min visit spent on counseling and coordination of care.    Data Reviewed Ultrasound:  Features suggesting adenomyosis.  Ovaries WNL's.  Assessment     H/O pelvic pain with intercourse.   LGSIL pap smear    Plan    Will follow pelvic pain clinically Patient will call to schedule colposcopy  No orders of the  defined types were placed in this encounter.  No orders of the defined types were placed in this encounter.

## 2017-06-09 ENCOUNTER — Encounter: Payer: Self-pay | Admitting: *Deleted

## 2020-12-18 ENCOUNTER — Encounter: Payer: Self-pay | Admitting: Obstetrics

## 2020-12-18 ENCOUNTER — Other Ambulatory Visit (HOSPITAL_COMMUNITY)
Admission: RE | Admit: 2020-12-18 | Discharge: 2020-12-18 | Disposition: A | Discharge status: Home / Self Care | Payer: No Typology Code available for payment source | Source: Ambulatory Visit | Attending: Obstetrics | Admitting: Obstetrics

## 2020-12-18 ENCOUNTER — Other Ambulatory Visit: Payer: Self-pay

## 2020-12-18 ENCOUNTER — Ambulatory Visit (HOSPITAL_BASED_OUTPATIENT_CLINIC_OR_DEPARTMENT_OTHER)
Admission: RE | Admit: 2020-12-18 | Discharge: 2020-12-18 | Disposition: A | Discharge status: Home / Self Care | Payer: No Typology Code available for payment source | Source: Ambulatory Visit | Attending: Obstetrics | Admitting: Obstetrics

## 2020-12-18 ENCOUNTER — Ambulatory Visit (INDEPENDENT_AMBULATORY_CARE_PROVIDER_SITE_OTHER): Payer: No Typology Code available for payment source | Admitting: Obstetrics

## 2020-12-18 VITALS — BP 110/78 | HR 92 | Ht 65.0 in | Wt 144.0 lb

## 2020-12-18 DIAGNOSIS — Z01419 Encounter for gynecological examination (general) (routine) without abnormal findings: Secondary | ICD-10-CM

## 2020-12-18 DIAGNOSIS — Z113 Encounter for screening for infections with a predominantly sexual mode of transmission: Secondary | ICD-10-CM

## 2020-12-18 DIAGNOSIS — Z1239 Encounter for other screening for malignant neoplasm of breast: Secondary | ICD-10-CM | POA: Insufficient documentation

## 2020-12-18 DIAGNOSIS — N898 Other specified noninflammatory disorders of vagina: Secondary | ICD-10-CM | POA: Insufficient documentation

## 2020-12-18 DIAGNOSIS — Z308 Encounter for other contraceptive management: Secondary | ICD-10-CM | POA: Diagnosis not present

## 2020-12-18 NOTE — Progress Notes (Signed)
Subjective:        Charlotte Anderson is a 40 y.o. female here for a routine exam.  Current complaints: Vaginal discharge.    Personal health questionnaire:  Is patient [name], have a family history of breast and/or ovarian cancer: no Is there a family history of uterine cancer diagnosed at age < 72, gastrointestinal cancer, urinary tract cancer, family member who is a Personnel officer syndrome-associated carrier: no Is the patient overweight and hypertensive, family history of diabetes, personal history of gestational diabetes, preeclampsia or PCOS: no Is patient over 43, have PCOS,  family history of premature CHD under age 24, diabetes, smoke, have hypertension or peripheral artery disease:  no At any time, has a partner hit, kicked or otherwise hurt or frightened you?: no Over the past 2 weeks, have you felt down, depressed or hopeless?: no Over the past 2 weeks, have you felt little interest or pleasure in doing things?:no   Gynecologic History Patient's last menstrual period was 12/04/2020. Contraception: tubal ligation Last Pap: 02-22-2016. Results were: LGSIL Last mammogram: none. Results were: none  Obstetric History OB History  Gravida Para Term Preterm AB Living  5 4 4   1 4   SAB IAB Ectopic Multiple Live Births    1     4    # Outcome Date GA Lbr Len/2nd Weight Sex Delivery Anes PTL Lv  5 Term 06/10/12 [redacted]w[redacted]d 05:22 / 00:12 7 lb 14.6 oz (3.589 kg) F Vag-Spont EPI  LIV     Birth Comments: Normal newborn  4 Term 2011 [redacted]w[redacted]d 12:00 8 lb 6 oz (3.799 kg) F Vag-Spont EPI  LIV  3 Term 2008 [redacted]w[redacted]d 06:00 8 lb 13 oz (3.997 kg) F Vag-Spont EPI  LIV  2 IAB 2005          1 Term 2000 [redacted]w[redacted]d 07:00 7 lb 12 oz (3.515 kg) F Vag-Spont EPI  LIV    Past Medical History:  Diagnosis Date   Abnormal Pap smear    SVD (spontaneous vaginal delivery)    x 4    Past Surgical History:  Procedure Laterality Date   LAPAROSCOPIC TUBAL LIGATION Bilateral 09/03/2012   Procedure: LAPAROSCOPIC TUBAL  LIGATION;  Surgeon: 09/05/2012, MD;  Location: [facility];  Service: Gynecology;  Laterality: Bilateral;   WISDOM TOOTH EXTRACTION       Current Outpatient Medications:    buPROPion (WELLBUTRIN XL) 300 MG 24 hr tablet, Take 300 mg by mouth daily., Disp: , Rfl:    escitalopram (LEXAPRO) 20 MG tablet, Take 20 mg by mouth daily., Disp: , Rfl:  No Known Allergies  Social History   Tobacco Use   Smoking status: Never   Smokeless tobacco: Never  Substance Use Topics   Alcohol use: Yes    Comment: occasionally     Family History  Problem Relation Age of Onset   Cancer Mother        ovarian   Hypertension Mother    Vision loss Maternal Grandmother        cataracts   Hypertension Maternal Grandmother    Heart disease Maternal Grandfather    Hypertension Maternal Grandfather    COPD Paternal Grandmother       Review of Systems  Constitutional: negative for fatigue and weight loss Respiratory: negative for cough and wheezing Cardiovascular: negative for chest pain, fatigue and palpitations Gastrointestinal: negative for abdominal pain and change in bowel habits Musculoskeletal:negative for myalgias Neurological: negative for gait problems and tremors Behavioral/Psych: negative for  abusive relationship, depression Endocrine: negative for temperature intolerance    Genitourinary: positive for vaginal discharge.  negative for abnormal menstrual periods, genital lesions, hot flashes, sexual problems  Integument/breast: negative for breast lump, breast tenderness, nipple discharge and skin lesion(s)    Objective:       BP 110/78   Pulse 92   Ht 5\' 5"  (1.651 m)   Wt 144 lb (65.3 kg)   LMP 12/04/2020   BMI 23.96 kg/m  General:   Alert and no distress  Skin:   no rash or abnormalities  Lungs:   clear to auscultation bilaterally  Heart:   regular rate and rhythm, S1, S2 normal, no murmur, click, rub or gallop  Breasts:   normal without suspicious masses, skin or nipple  changes or axillary nodes  Abdomen:  normal findings: no organomegaly, soft, non-tender and no hernia  Pelvis:  External genitalia: normal general appearance Urinary system: urethral meatus normal and bladder without fullness, nontender Vaginal: normal without tenderness, induration or masses Cervix: normal appearance Adnexa: normal bimanual exam Uterus: anteverted and non-tender, normal size   Lab Review Urine pregnancy test Labs reviewed yes Radiologic studies reviewed no  I have spent a total of 20 minutes of face-to-face time, excluding clinical staff time, reviewing notes and preparing to see patient, ordering tests and/or medications, and counseling the patient.   Assessment:    1. Encounter for gynecological examination with Papanicolaou smear of cervix Rx: - Cytology - PAP( [place])  2. Vaginal discharge Rx: - Cervicovaginal ancillary only( [place])  3. Screen for STD (sexually transmitted disease) Rx: - HIV Antibody (routine testing w rflx) - Hepatitis B surface antigen - RPR - Hepatitis C antibody  4. Screening breast examination Rx: - MM Digital Screening; Future  5. Encounter for other contraceptive management - had tubal ligation.  No complaints     Plan:    Education reviewed: calcium supplements, depression evaluation, low fat, low cholesterol diet, safe sex/STD prevention, self breast exams, skin cancer screening, and weight bearing exercise. Mammogram ordered. Follow up in: 1 year.     Orders Placed This Encounter  Procedures   MM Digital Screening    Standing Status:   Future    Standing Expiration Date:   12/18/2021    Order Specific Question:   Reason for Exam (SYMPTOM  OR DIAGNOSIS REQUIRED)    Answer:   Screening    Order Specific Question:   Is the patient pregnant?    Answer:   No    Order Specific Question:   Preferred imaging location?    Answer:   [facility]   HIV Antibody (routine testing w rflx)   Hepatitis B  surface antigen   RPR   Hepatitis C antibody      12/20/2021, MD 12/18/2020 10:00 AM

## 2020-12-19 ENCOUNTER — Other Ambulatory Visit: Payer: Self-pay | Admitting: Obstetrics

## 2020-12-19 DIAGNOSIS — N760 Acute vaginitis: Secondary | ICD-10-CM

## 2020-12-19 LAB — CERVICOVAGINAL ANCILLARY ONLY
Bacterial Vaginitis (gardnerella): POSITIVE — AB
Candida Glabrata: NEGATIVE
Candida Vaginitis: NEGATIVE
Chlamydia: NEGATIVE
Comment: NEGATIVE
Comment: NEGATIVE
Comment: NEGATIVE
Comment: NEGATIVE
Comment: NEGATIVE
Comment: NORMAL
Neisseria Gonorrhea: NEGATIVE
Trichomonas: NEGATIVE

## 2020-12-19 LAB — HEPATITIS C ANTIBODY: Hep C Virus Ab: <0.1 s/co ratio (ref 0.0–0.9)

## 2020-12-19 LAB — HEPATITIS B SURFACE ANTIGEN: Hepatitis B Surface Ag: NEGATIVE

## 2020-12-19 LAB — RPR: RPR Ser Ql: NONREACTIVE

## 2020-12-19 LAB — HIV ANTIBODY (ROUTINE TESTING W REFLEX): HIV Screen 4th Generation wRfx: NONREACTIVE

## 2020-12-19 MED ORDER — METRONIDAZOLE 500 MG PO TABS: 500.0000 mg | ORAL_TABLET | Freq: Two times a day (BID) | ORAL | 2 refills | Status: AC

## 2020-12-20 ENCOUNTER — Encounter: Payer: Self-pay | Admitting: *Deleted

## 2020-12-20 NOTE — Progress Notes (Signed)
TC to patient to notify of BV and RX for Flagyl. No answer. VM full. Patient is active in MyChart. Message sent including patient education on BV.

## 2020-12-21 LAB — CYTOLOGY - PAP
Comment: NEGATIVE
Diagnosis: NEGATIVE
Diagnosis: REACTIVE
High risk HPV: POSITIVE — AB

## 2020-12-24 ENCOUNTER — Other Ambulatory Visit: Payer: Self-pay | Admitting: Obstetrics

## 2020-12-24 DIAGNOSIS — R928 Other abnormal and inconclusive findings on diagnostic imaging of breast: Secondary | ICD-10-CM

## 2023-08-29 IMAGING — MG MM DIGITAL SCREENING BILAT W/ TOMO AND CAD
8 series · 8 of 24 positions shown · non-contrast
Comparison: None.

CLINICAL DATA: Screening.

EXAM:
DIGITAL SCREENING BILATERAL MAMMOGRAM WITH TOMOSYNTHESIS AND CAD
TECHNIQUE: Bilateral screening digital craniocaudal and mediolateral oblique
mammograms were obtained. Bilateral screening digital breast
tomosynthesis was performed. The images were evaluated with
computer-aided detection.

[L MLO synth-2D]
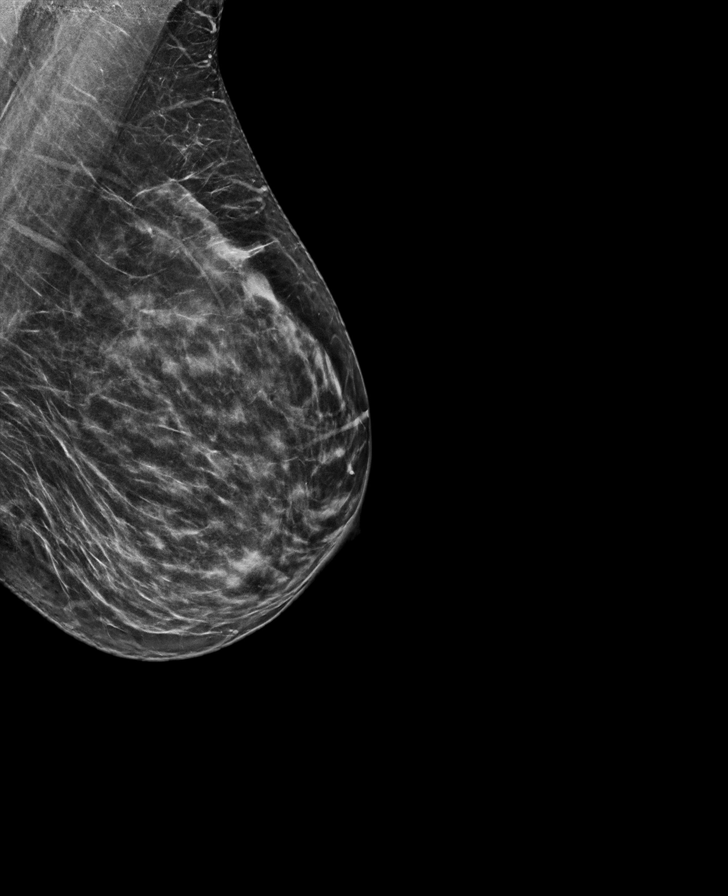

[R CC synth-2D]
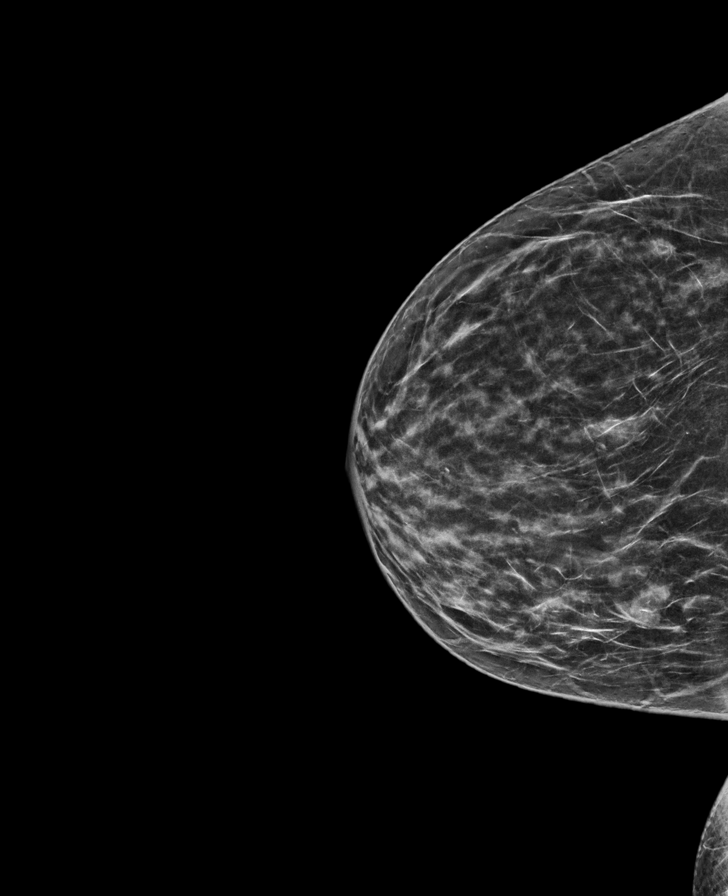

[R MLO synth-2D]
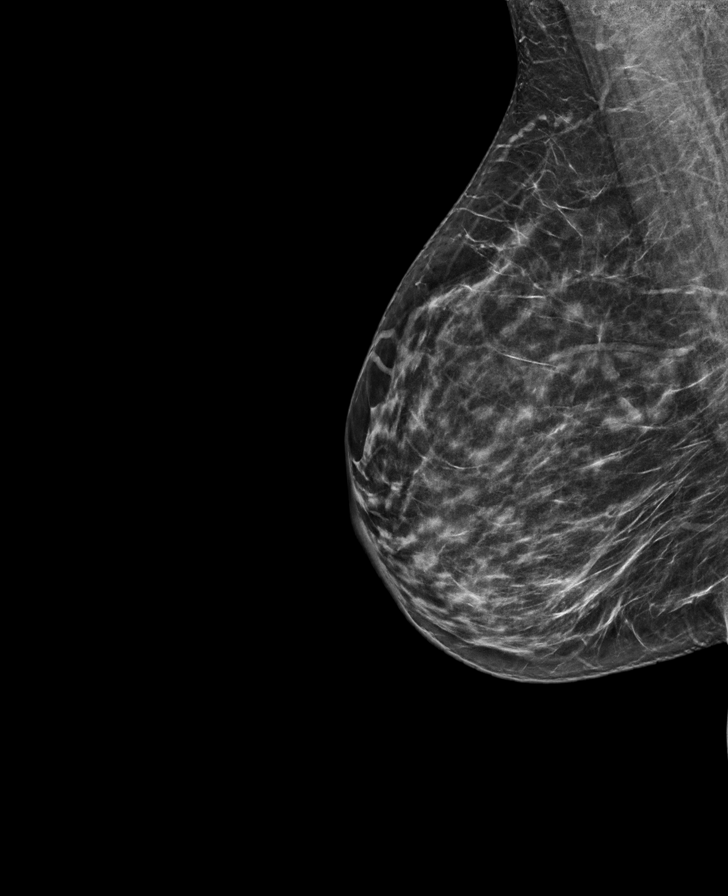

[L CC synth-2D]
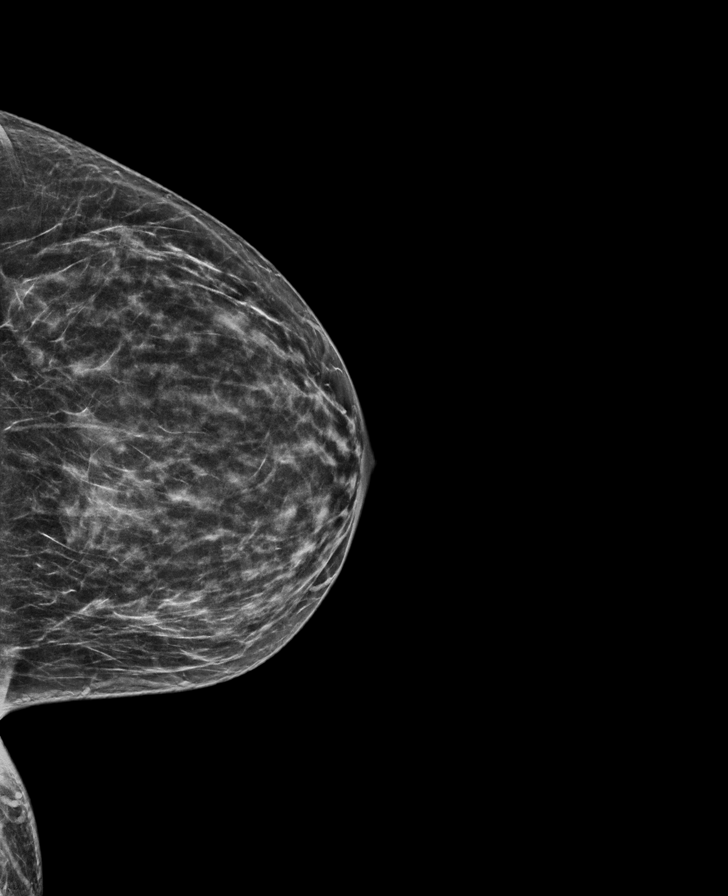

[L MLO tomo · tomo slice 33/65.0]
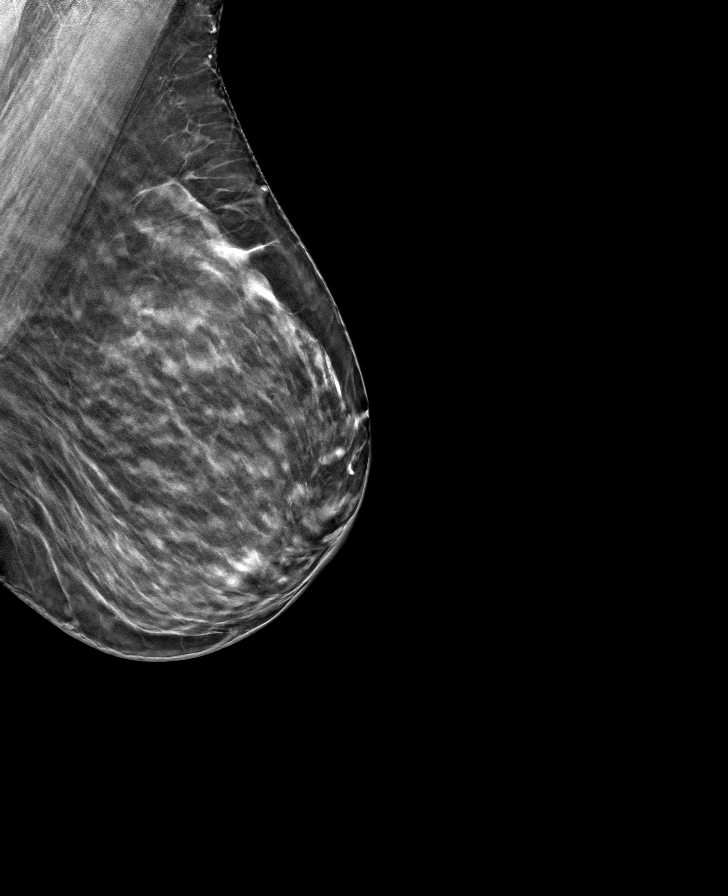

[R MLO tomo · tomo slice 32/63.0]
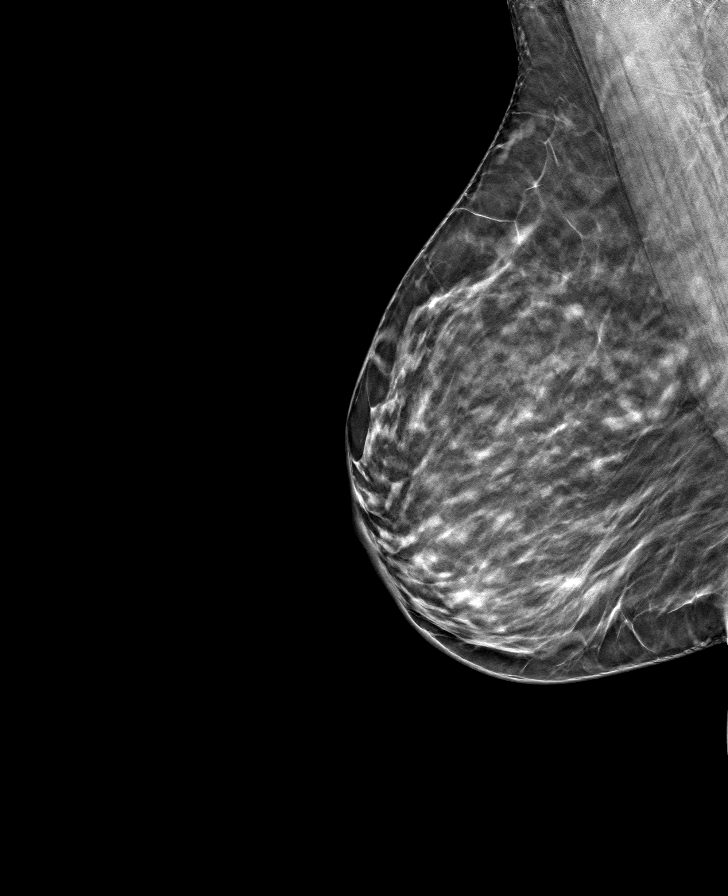

[R CC tomo · tomo slice 30/59.0]
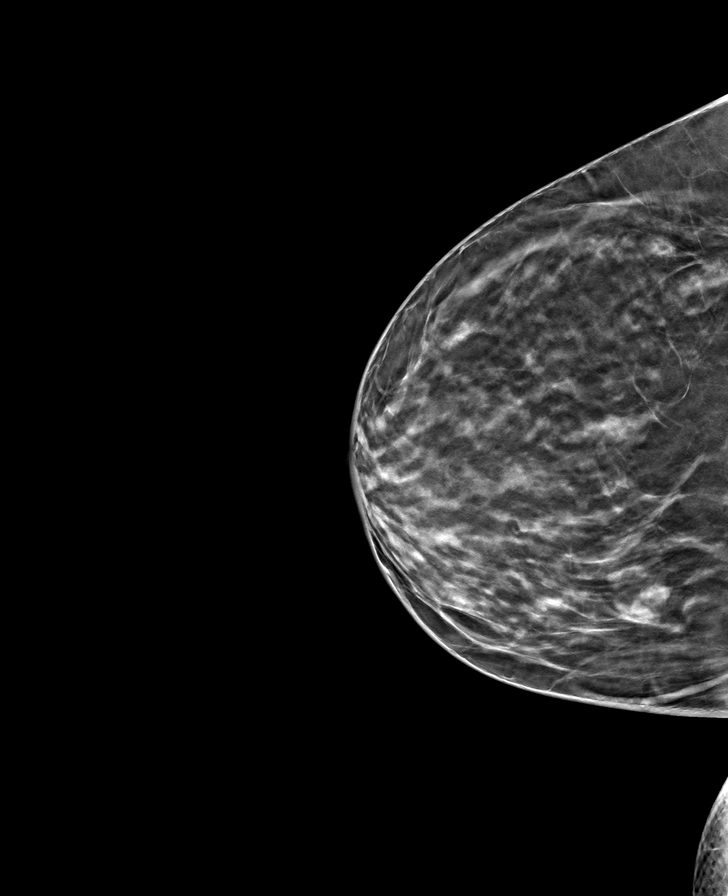

[L CC tomo · tomo slice 31/60.0]
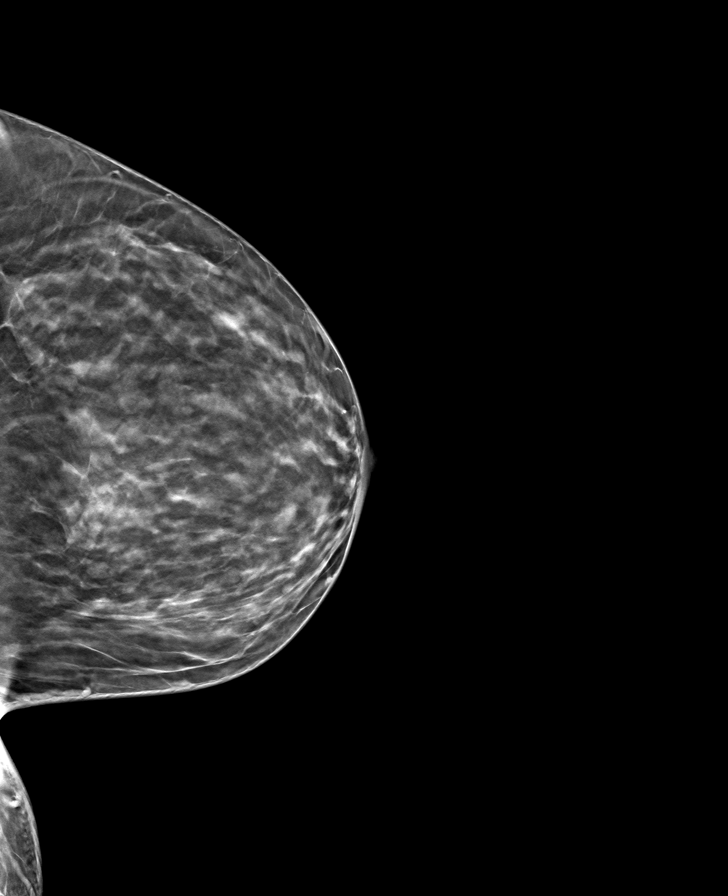

[8 of 24 positions shown; findings below may reference images not displayed]

ACR Breast Density Category c: The breast tissue is heterogeneously
dense, which may obscure small masses.
FINDINGS: In the right breast, a possible asymmetry warrants further
evaluation. In the left breast, no findings suspicious for
malignancy.
IMPRESSION: Further evaluation is suggested for possible asymmetry in the right
breast.

RECOMMENDATION:
Diagnostic mammogram and possibly ultrasound of the right breast.
(Code:[id number])

The patient will be contacted regarding the findings, and additional
imaging will be scheduled.

BI-RADS CATEGORY  0: Incomplete. Need additional imaging evaluation
and/or prior mammograms for comparison.
